# Patient Record
Sex: Female | Born: 1995 | Race: Black or African American | Hispanic: No | Marital: Single | Smoking: Never smoker
Health system: Southern US, Community
[De-identification: ages and names within clinical notes are randomized; demographics above are authoritative.]

## PROBLEM LIST (undated history)

## (undated) ENCOUNTER — Inpatient Hospital Stay (HOSPITAL_COMMUNITY): Payer: Self-pay

## (undated) DIAGNOSIS — Z789 Other specified health status: Secondary | ICD-10-CM

## (undated) HISTORY — DX: Other specified health status: Z78.9

## (undated) HISTORY — PX: THERAPEUTIC ABORTION: SHX798

---
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
## 2014-01-02 ENCOUNTER — Encounter: Payer: Self-pay | Admitting: Family Medicine

## 2014-01-03 ENCOUNTER — Other Ambulatory Visit: Payer: Self-pay

## 2014-01-03 MED ORDER — PRENATAL VITAMINS 0.8 MG PO TABS: 1.0000 | ORAL_TABLET | Freq: Every day | ORAL | Status: DC

## 2014-01-09 ENCOUNTER — Ambulatory Visit (INDEPENDENT_AMBULATORY_CARE_PROVIDER_SITE_OTHER): Payer: Medicaid Other | Admitting: Family Medicine

## 2014-01-09 ENCOUNTER — Encounter: Payer: Self-pay | Admitting: Family Medicine

## 2014-01-09 VITALS — BP 102/67 | Temp 97.8°F | Wt 125.7 lb

## 2014-01-09 DIAGNOSIS — Z3400 Encounter for supervision of normal first pregnancy, unspecified trimester: Secondary | ICD-10-CM

## 2014-01-09 LAB — POCT URINALYSIS DIP (DEVICE)
BILIRUBIN URINE: NEGATIVE
GLUCOSE, UA: NEGATIVE mg/dL
HGB URINE DIPSTICK: NEGATIVE
KETONES UR: NEGATIVE mg/dL
Nitrite: NEGATIVE
Protein, ur: NEGATIVE mg/dL
SPECIFIC GRAVITY, URINE: 1.02 (ref 1.005–1.030)
UROBILINOGEN UA: 1 mg/dL (ref 0.0–1.0)
pH: 7.5 (ref 5.0–8.0)

## 2014-01-09 NOTE — Progress Notes (Signed)
Pulse: 97

## 2014-01-09 NOTE — Patient Instructions (Signed)
Third Trimester of Pregnancy  The third trimester is from week 29 through week 42, months 7 through 9. The third trimester is a time when the fetus is growing rapidly. At the end of the ninth month, the fetus is about 20 inches in length and weighs 6 10 pounds.   BODY CHANGES  Your body goes through many changes during pregnancy. The changes vary from woman to woman.    Your weight will continue to increase. You can expect to gain 25 35 pounds (11 16 kg) by the end of the pregnancy.   You may begin to get stretch marks on your hips, abdomen, and breasts.   You may urinate more often because the fetus is moving lower into your pelvis and pressing on your bladder.   You may develop or continue to have heartburn as a result of your pregnancy.   You may develop constipation because certain hormones are causing the muscles that push waste through your intestines to slow down.   You may develop hemorrhoids or swollen, bulging veins (varicose veins).   You may have pelvic pain because of the weight gain and pregnancy hormones relaxing your joints between the bones in your pelvis. Back aches may result from over exertion of the muscles supporting your posture.   Your breasts will continue to grow and be tender. A yellow discharge may leak from your breasts called colostrum.   Your belly button may stick out.   You may feel short of breath because of your expanding uterus.   You may notice the fetus "dropping," or moving lower in your abdomen.   You may have a bloody mucus discharge. This usually occurs a few days to a week before labor begins.   Your cervix becomes thin and soft (effaced) near your due date.  WHAT TO EXPECT AT YOUR PRENATAL EXAMS   You will have prenatal exams every 2 weeks until week 36. Then, you will have weekly prenatal exams. During a routine prenatal visit:   You will be weighed to make sure you and the fetus are growing normally.   Your blood pressure is taken.   Your abdomen will be  measured to track your baby's growth.   The fetal heartbeat will be listened to.   Any test results from the previous visit will be discussed.   You may have a cervical check near your due date to see if you have effaced.  At around 36 weeks, your caregiver will check your cervix. At the same time, your caregiver will also perform a test on the secretions of the vaginal tissue. This test is to determine if a type of bacteria, Group B streptococcus, is present. Your caregiver will explain this further.  Your caregiver may ask you:   What your birth plan is.   How you are feeling.   If you are feeling the baby move.   If you have had any abnormal symptoms, such as leaking fluid, bleeding, severe headaches, or abdominal cramping.   If you have any questions.  Other tests or screenings that may be performed during your third trimester include:   Blood tests that check for low iron levels (anemia).   Fetal testing to check the health, activity level, and growth of the fetus. Testing is done if you have certain medical conditions or if there are problems during the pregnancy.  FALSE LABOR  You may feel small, irregular contractions that eventually go away. These are called Braxton Hicks contractions, or   false labor. Contractions may last for hours, days, or even weeks before true labor sets in. If contractions come at regular intervals, intensify, or become painful, it is best to be seen by your caregiver.   SIGNS OF LABOR    Menstrual-like cramps.   Contractions that are 5 minutes apart or less.   Contractions that start on the top of the uterus and spread down to the lower abdomen and back.   A sense of increased pelvic pressure or back pain.   A watery or bloody mucus discharge that comes from the vagina.  If you have any of these signs before the 37th week of pregnancy, call your caregiver right away. You need to go to the hospital to get checked immediately.  HOME CARE INSTRUCTIONS    Avoid all  smoking, herbs, alcohol, and unprescribed drugs. These chemicals affect the formation and growth of the baby.   Follow your caregiver's instructions regarding medicine use. There are medicines that are either safe or unsafe to take during pregnancy.   Exercise only as directed by your caregiver. Experiencing uterine cramps is a good sign to stop exercising.   Continue to eat regular, healthy meals.   Wear a good support bra for breast tenderness.   Do not use hot tubs, steam rooms, or saunas.   Wear your seat belt at all times when driving.   Avoid raw meat, uncooked cheese, cat litter boxes, and soil used by cats. These carry germs that can cause birth defects in the baby.   Take your prenatal vitamins.   Try taking a stool softener (if your caregiver approves) if you develop constipation. Eat more high-fiber foods, such as fresh vegetables or fruit and whole grains. Drink plenty of fluids to keep your urine clear or pale yellow.   Take warm sitz baths to soothe any pain or discomfort caused by hemorrhoids. Use hemorrhoid cream if your caregiver approves.   If you develop varicose veins, wear support hose. Elevate your feet for 15 minutes, 3 4 times a day. Limit salt in your diet.   Avoid heavy lifting, wear low heal shoes, and practice good posture.   Rest a lot with your legs elevated if you have leg cramps or low back pain.   Visit your dentist if you have not gone during your pregnancy. Use a soft toothbrush to brush your teeth and be gentle when you floss.   A sexual relationship may be continued unless your caregiver directs you otherwise.   Do not travel far distances unless it is absolutely necessary and only with the approval of your caregiver.   Take prenatal classes to understand, practice, and ask questions about the labor and delivery.   Make a trial run to the hospital.   Pack your hospital bag.   Prepare the baby's nursery.   Continue to go to all your prenatal visits as directed  by your caregiver.  SEEK MEDICAL CARE IF:   You are unsure if you are in labor or if your water has broken.   You have dizziness.   You have mild pelvic cramps, pelvic pressure, or nagging pain in your abdominal area.   You have persistent nausea, vomiting, or diarrhea.   You have a bad smelling vaginal discharge.   You have pain with urination.  SEEK IMMEDIATE MEDICAL CARE IF:    You have a fever.   You are leaking fluid from your vagina.   You have spotting or bleeding from your vagina.     You have severe abdominal cramping or pain.   You have rapid weight loss or gain.   You have shortness of breath with chest pain.   You notice sudden or extreme swelling of your face, hands, ankles, feet, or legs.   You have not felt your baby move in over an hour.   You have severe headaches that do not go away with medicine.   You have vision changes.  Document Released: 11/04/2001 Document Revised: 07/13/2013 Document Reviewed: 01/11/2013  [employer] Patient Information 2014 [employer].

## 2014-01-09 NOTE — Progress Notes (Signed)
+  FM, no lof, no vb, no ctx  Cindy Ware is a 18 y.o. G1P0 at 708w0d by L=19 here for ROB visit.  Discussed with Patient:  -Plans to bottle feed.  All questions answered. -Continue prenatal vitamins. -Reviewed fetal kick counts (Pt to perform daily at a time when the baby is active, lie laterally with both hands on belly in quiet room and count all movements (hiccups, shoulder rolls, obvious kicks, etc); pt is to report to clinic or MAU for less than 10 movements felt in a one hour time period-pt told as soon as she counts 10 movements the count is complete.)  - Routine precautions discussed (depression, infection s/s).   Patient provided with all pertinent phone numbers for emergencies. - RTC for any VB, regular, painful cramps/ctxs occurring at a rate of >2/10 min, fever (100.5 or higher), n/v/d, any pain that is unresolving or worsening, LOF, decreased fetal movement, CP, SOB, edema  Problems: Patient Active Problem List   Diagnosis Date Noted  . Supervision of normal first pregnancy 08/17/2013    To Do: 1.   [ ]  Vaccines: recd [ ]  BCM:  depo  Edu: [x ] PTL precautions; [ ]  BF class; [ ]  childbirth class; [ ]   BF counseling;

## 2014-02-01 ENCOUNTER — Encounter: Payer: Self-pay | Admitting: Obstetrics and Gynecology

## 2014-02-01 ENCOUNTER — Ambulatory Visit (INDEPENDENT_AMBULATORY_CARE_PROVIDER_SITE_OTHER): Payer: Medicaid Other | Admitting: Obstetrics and Gynecology

## 2014-02-01 VITALS — BP 109/70 | Temp 97.4°F | Wt 128.2 lb

## 2014-02-01 DIAGNOSIS — Z3400 Encounter for supervision of normal first pregnancy, unspecified trimester: Secondary | ICD-10-CM

## 2014-02-01 LAB — POCT URINALYSIS DIP (DEVICE)
BILIRUBIN URINE: NEGATIVE
GLUCOSE, UA: NEGATIVE mg/dL
KETONES UR: NEGATIVE mg/dL
NITRITE: NEGATIVE
PH: 7 (ref 5.0–8.0)
Protein, ur: NEGATIVE mg/dL
SPECIFIC GRAVITY, URINE: 1.02 (ref 1.005–1.030)
Urobilinogen, UA: 0.2 mg/dL (ref 0.0–1.0)

## 2014-02-01 NOTE — Patient Instructions (Signed)
Third Trimester of Pregnancy  The third trimester is from week 29 through week 42, months 7 through 9. The third trimester is a time when the fetus is growing rapidly. At the end of the ninth month, the fetus is about 20 inches in length and weighs 6 10 pounds.   BODY CHANGES  Your body goes through many changes during pregnancy. The changes vary from woman to woman.    Your weight will continue to increase. You can expect to gain 25 35 pounds (11 16 kg) by the end of the pregnancy.   You may begin to get stretch marks on your hips, abdomen, and breasts.   You may urinate more often because the fetus is moving lower into your pelvis and pressing on your bladder.   You may develop or continue to have heartburn as a result of your pregnancy.   You may develop constipation because certain hormones are causing the muscles that push waste through your intestines to slow down.   You may develop hemorrhoids or swollen, bulging veins (varicose veins).   You may have pelvic pain because of the weight gain and pregnancy hormones relaxing your joints between the bones in your pelvis. Back aches may result from over exertion of the muscles supporting your posture.   Your breasts will continue to grow and be tender. A yellow discharge may leak from your breasts called colostrum.   Your belly button may stick out.   You may feel short of breath because of your expanding uterus.   You may notice the fetus "dropping," or moving lower in your abdomen.   You may have a bloody mucus discharge. This usually occurs a few days to a week before labor begins.   Your cervix becomes thin and soft (effaced) near your due date.  WHAT TO EXPECT AT YOUR PRENATAL EXAMS   You will have prenatal exams every 2 weeks until week 36. Then, you will have weekly prenatal exams. During a routine prenatal visit:   You will be weighed to make sure you and the fetus are growing normally.   Your blood pressure is taken.   Your abdomen will be  measured to track your baby's growth.   The fetal heartbeat will be listened to.   Any test results from the previous visit will be discussed.   You may have a cervical check near your due date to see if you have effaced.  At around 36 weeks, your caregiver will check your cervix. At the same time, your caregiver will also perform a test on the secretions of the vaginal tissue. This test is to determine if a type of bacteria, Group B streptococcus, is present. Your caregiver will explain this further.  Your caregiver may ask you:   What your birth plan is.   How you are feeling.   If you are feeling the baby move.   If you have had any abnormal symptoms, such as leaking fluid, bleeding, severe headaches, or abdominal cramping.   If you have any questions.  Other tests or screenings that may be performed during your third trimester include:   Blood tests that check for low iron levels (anemia).   Fetal testing to check the health, activity level, and growth of the fetus. Testing is done if you have certain medical conditions or if there are problems during the pregnancy.  FALSE LABOR  You may feel small, irregular contractions that eventually go away. These are called Braxton Hicks contractions, or   false labor. Contractions may last for hours, days, or even weeks before true labor sets in. If contractions come at regular intervals, intensify, or become painful, it is best to be seen by your caregiver.   SIGNS OF LABOR    Menstrual-like cramps.   Contractions that are 5 minutes apart or less.   Contractions that start on the top of the uterus and spread down to the lower abdomen and back.   A sense of increased pelvic pressure or back pain.   A watery or bloody mucus discharge that comes from the vagina.  If you have any of these signs before the 37th week of pregnancy, call your caregiver right away. You need to go to the hospital to get checked immediately.  HOME CARE INSTRUCTIONS    Avoid all  smoking, herbs, alcohol, and unprescribed drugs. These chemicals affect the formation and growth of the baby.   Follow your caregiver's instructions regarding medicine use. There are medicines that are either safe or unsafe to take during pregnancy.   Exercise only as directed by your caregiver. Experiencing uterine cramps is a good sign to stop exercising.   Continue to eat regular, healthy meals.   Wear a good support bra for breast tenderness.   Do not use hot tubs, steam rooms, or saunas.   Wear your seat belt at all times when driving.   Avoid raw meat, uncooked cheese, cat litter boxes, and soil used by cats. These carry germs that can cause birth defects in the baby.   Take your prenatal vitamins.   Try taking a stool softener (if your caregiver approves) if you develop constipation. Eat more high-fiber foods, such as fresh vegetables or fruit and whole grains. Drink plenty of fluids to keep your urine clear or pale yellow.   Take warm sitz baths to soothe any pain or discomfort caused by hemorrhoids. Use hemorrhoid cream if your caregiver approves.   If you develop varicose veins, wear support hose. Elevate your feet for 15 minutes, 3 4 times a day. Limit salt in your diet.   Avoid heavy lifting, wear low heal shoes, and practice good posture.   Rest a lot with your legs elevated if you have leg cramps or low back pain.   Visit your dentist if you have not gone during your pregnancy. Use a soft toothbrush to brush your teeth and be gentle when you floss.   A sexual relationship may be continued unless your caregiver directs you otherwise.   Do not travel far distances unless it is absolutely necessary and only with the approval of your caregiver.   Take prenatal classes to understand, practice, and ask questions about the labor and delivery.   Make a trial run to the hospital.   Pack your hospital bag.   Prepare the baby's nursery.   Continue to go to all your prenatal visits as directed  by your caregiver.  SEEK MEDICAL CARE IF:   You are unsure if you are in labor or if your water has broken.   You have dizziness.   You have mild pelvic cramps, pelvic pressure, or nagging pain in your abdominal area.   You have persistent nausea, vomiting, or diarrhea.   You have a bad smelling vaginal discharge.   You have pain with urination.  SEEK IMMEDIATE MEDICAL CARE IF:    You have a fever.   You are leaking fluid from your vagina.   You have spotting or bleeding from your vagina.     You have severe abdominal cramping or pain.   You have rapid weight loss or gain.   You have shortness of breath with chest pain.   You notice sudden or extreme swelling of your face, hands, ankles, feet, or legs.   You have not felt your baby move in over an hour.   You have severe headaches that do not go away with medicine.   You have vision changes.  Document Released: 11/04/2001 Document Revised: 07/13/2013 Document Reviewed: 01/11/2013  [employer] Patient Information 2014 [employer].

## 2014-02-01 NOTE — Progress Notes (Signed)
Doing well. Fetus very active. RLP, Depo discussed. Encouraged to try breastfeeding.

## 2014-02-01 NOTE — Progress Notes (Signed)
P - 97 

## 2014-02-15 ENCOUNTER — Ambulatory Visit (INDEPENDENT_AMBULATORY_CARE_PROVIDER_SITE_OTHER): Payer: Medicaid Other | Admitting: Family

## 2014-02-15 VITALS — BP 103/73 | Wt 129.0 lb

## 2014-02-15 DIAGNOSIS — R8271 Bacteriuria: Secondary | ICD-10-CM

## 2014-02-15 DIAGNOSIS — O99891 Other specified diseases and conditions complicating pregnancy: Secondary | ICD-10-CM

## 2014-02-15 DIAGNOSIS — Z3400 Encounter for supervision of normal first pregnancy, unspecified trimester: Secondary | ICD-10-CM

## 2014-02-15 DIAGNOSIS — O9989 Other specified diseases and conditions complicating pregnancy, childbirth and the puerperium: Secondary | ICD-10-CM

## 2014-02-15 LAB — POCT URINALYSIS DIP (DEVICE)
BILIRUBIN URINE: NEGATIVE
GLUCOSE, UA: NEGATIVE mg/dL
HGB URINE DIPSTICK: NEGATIVE
KETONES UR: NEGATIVE mg/dL
NITRITE: NEGATIVE
PH: 7 (ref 5.0–8.0)
Protein, ur: NEGATIVE mg/dL
Specific Gravity, Urine: 1.015 (ref 1.005–1.030)
Urobilinogen, UA: 0.2 mg/dL (ref 0.0–1.0)

## 2014-02-15 LAB — OB RESULTS CONSOLE GC/CHLAMYDIA
Chlamydia: NEGATIVE
GC PROBE AMP, GENITAL: NEGATIVE

## 2014-02-15 LAB — OB RESULTS CONSOLE GBS: GBS: NEGATIVE

## 2014-02-15 NOTE — Progress Notes (Signed)
No UTI symptoms > urine culture sent.

## 2014-02-15 NOTE — Progress Notes (Signed)
No questions or concerns; doing well.  GBS and GC/CT collected.

## 2014-02-15 NOTE — Progress Notes (Signed)
Pulse: 100

## 2014-02-16 LAB — GC/CHLAMYDIA PROBE AMP
CT PROBE, AMP APTIMA: NEGATIVE
GC PROBE AMP APTIMA: NEGATIVE

## 2014-02-17 LAB — CULTURE, OB URINE: Colony Count: 15000

## 2014-02-17 LAB — CULTURE, BETA STREP (GROUP B ONLY)

## 2014-02-23 ENCOUNTER — Ambulatory Visit (INDEPENDENT_AMBULATORY_CARE_PROVIDER_SITE_OTHER): Payer: Medicaid Other | Admitting: Obstetrics & Gynecology

## 2014-02-23 VITALS — BP 108/74 | Wt 132.2 lb

## 2014-02-23 DIAGNOSIS — Z3400 Encounter for supervision of normal first pregnancy, unspecified trimester: Secondary | ICD-10-CM

## 2014-02-23 LAB — POCT URINALYSIS DIP (DEVICE)
Bilirubin Urine: NEGATIVE
Glucose, UA: NEGATIVE mg/dL
Hgb urine dipstick: NEGATIVE
KETONES UR: NEGATIVE mg/dL
Nitrite: NEGATIVE
PH: 7 (ref 5.0–8.0)
PROTEIN: NEGATIVE mg/dL
SPECIFIC GRAVITY, URINE: 1.015 (ref 1.005–1.030)
UROBILINOGEN UA: 0.2 mg/dL (ref 0.0–1.0)

## 2014-02-23 NOTE — Patient Instructions (Signed)
Third Trimester of Pregnancy  The third trimester is from week 29 through week 42, months 7 through 9. The third trimester is a time when the fetus is growing rapidly. At the end of the ninth month, the fetus is about 20 inches in length and weighs 6 10 pounds.   BODY CHANGES  Your body goes through many changes during pregnancy. The changes vary from woman to woman.    Your weight will continue to increase. You can expect to gain 25 35 pounds (11 16 kg) by the end of the pregnancy.   You may begin to get stretch marks on your hips, abdomen, and breasts.   You may urinate more often because the fetus is moving lower into your pelvis and pressing on your bladder.   You may develop or continue to have heartburn as a result of your pregnancy.   You may develop constipation because certain hormones are causing the muscles that push waste through your intestines to slow down.   You may develop hemorrhoids or swollen, bulging veins (varicose veins).   You may have pelvic pain because of the weight gain and pregnancy hormones relaxing your joints between the bones in your pelvis. Back aches may result from over exertion of the muscles supporting your posture.   Your breasts will continue to grow and be tender. A yellow discharge may leak from your breasts called colostrum.   Your belly button may stick out.   You may feel short of breath because of your expanding uterus.   You may notice the fetus "dropping," or moving lower in your abdomen.   You may have a bloody mucus discharge. This usually occurs a few days to a week before labor begins.   Your cervix becomes thin and soft (effaced) near your due date.  WHAT TO EXPECT AT YOUR PRENATAL EXAMS   You will have prenatal exams every 2 weeks until week 36. Then, you will have weekly prenatal exams. During a routine prenatal visit:   You will be weighed to make sure you and the fetus are growing normally.   Your blood pressure is taken.   Your abdomen will be  measured to track your baby's growth.   The fetal heartbeat will be listened to.   Any test results from the previous visit will be discussed.   You may have a cervical check near your due date to see if you have effaced.  At around 36 weeks, your caregiver will check your cervix. At the same time, your caregiver will also perform a test on the secretions of the vaginal tissue. This test is to determine if a type of bacteria, Group B streptococcus, is present. Your caregiver will explain this further.  Your caregiver may ask you:   What your birth plan is.   How you are feeling.   If you are feeling the baby move.   If you have had any abnormal symptoms, such as leaking fluid, bleeding, severe headaches, or abdominal cramping.   If you have any questions.  Other tests or screenings that may be performed during your third trimester include:   Blood tests that check for low iron levels (anemia).   Fetal testing to check the health, activity level, and growth of the fetus. Testing is done if you have certain medical conditions or if there are problems during the pregnancy.  FALSE LABOR  You may feel small, irregular contractions that eventually go away. These are called Braxton Hicks contractions, or   false labor. Contractions may last for hours, days, or even weeks before true labor sets in. If contractions come at regular intervals, intensify, or become painful, it is best to be seen by your caregiver.   SIGNS OF LABOR    Menstrual-like cramps.   Contractions that are 5 minutes apart or less.   Contractions that start on the top of the uterus and spread down to the lower abdomen and back.   A sense of increased pelvic pressure or back pain.   A watery or bloody mucus discharge that comes from the vagina.  If you have any of these signs before the 37th week of pregnancy, call your caregiver right away. You need to go to the hospital to get checked immediately.  HOME CARE INSTRUCTIONS    Avoid all  smoking, herbs, alcohol, and unprescribed drugs. These chemicals affect the formation and growth of the baby.   Follow your caregiver's instructions regarding medicine use. There are medicines that are either safe or unsafe to take during pregnancy.   Exercise only as directed by your caregiver. Experiencing uterine cramps is a good sign to stop exercising.   Continue to eat regular, healthy meals.   Wear a good support bra for breast tenderness.   Do not use hot tubs, steam rooms, or saunas.   Wear your seat belt at all times when driving.   Avoid raw meat, uncooked cheese, cat litter boxes, and soil used by cats. These carry germs that can cause birth defects in the baby.   Take your prenatal vitamins.   Try taking a stool softener (if your caregiver approves) if you develop constipation. Eat more high-fiber foods, such as fresh vegetables or fruit and whole grains. Drink plenty of fluids to keep your urine clear or pale yellow.   Take warm sitz baths to soothe any pain or discomfort caused by hemorrhoids. Use hemorrhoid cream if your caregiver approves.   If you develop varicose veins, wear support hose. Elevate your feet for 15 minutes, 3 4 times a day. Limit salt in your diet.   Avoid heavy lifting, wear low heal shoes, and practice good posture.   Rest a lot with your legs elevated if you have leg cramps or low back pain.   Visit your dentist if you have not gone during your pregnancy. Use a soft toothbrush to brush your teeth and be gentle when you floss.   A sexual relationship may be continued unless your caregiver directs you otherwise.   Do not travel far distances unless it is absolutely necessary and only with the approval of your caregiver.   Take prenatal classes to understand, practice, and ask questions about the labor and delivery.   Make a trial run to the hospital.   Pack your hospital bag.   Prepare the baby's nursery.   Continue to go to all your prenatal visits as directed  by your caregiver.  SEEK MEDICAL CARE IF:   You are unsure if you are in labor or if your water has broken.   You have dizziness.   You have mild pelvic cramps, pelvic pressure, or nagging pain in your abdominal area.   You have persistent nausea, vomiting, or diarrhea.   You have a bad smelling vaginal discharge.   You have pain with urination.  SEEK IMMEDIATE MEDICAL CARE IF:    You have a fever.   You are leaking fluid from your vagina.   You have spotting or bleeding from your vagina.     You have severe abdominal cramping or pain.   You have rapid weight loss or gain.   You have shortness of breath with chest pain.   You notice sudden or extreme swelling of your face, hands, ankles, feet, or legs.   You have not felt your baby move in over an hour.   You have severe headaches that do not go away with medicine.   You have vision changes.  Document Released: 11/04/2001 Document Revised: 07/13/2013 Document Reviewed: 01/11/2013  [employer] Patient Information 2014 [employer].

## 2014-02-23 NOTE — Progress Notes (Signed)
Pulse- 87 

## 2014-02-23 NOTE — Progress Notes (Signed)
Doing well. Fetus active. No bleeding, fluid leaking, cramping/contractions. Given handout on circumcision locations. Vertex via [place]. F/u in 1 week.

## 2014-03-01 ENCOUNTER — Ambulatory Visit (INDEPENDENT_AMBULATORY_CARE_PROVIDER_SITE_OTHER): Payer: Medicaid Other | Admitting: Obstetrics and Gynecology

## 2014-03-01 VITALS — BP 107/71 | Temp 96.5°F | Wt 134.0 lb

## 2014-03-01 DIAGNOSIS — Z3400 Encounter for supervision of normal first pregnancy, unspecified trimester: Secondary | ICD-10-CM

## 2014-03-01 DIAGNOSIS — O09899 Supervision of other high risk pregnancies, unspecified trimester: Secondary | ICD-10-CM

## 2014-03-01 DIAGNOSIS — O09893 Supervision of other high risk pregnancies, third trimester: Secondary | ICD-10-CM | POA: Insufficient documentation

## 2014-03-01 LAB — POCT URINALYSIS DIP (DEVICE)
Bilirubin Urine: NEGATIVE
Glucose, UA: NEGATIVE mg/dL
HGB URINE DIPSTICK: NEGATIVE
Ketones, ur: NEGATIVE mg/dL
NITRITE: NEGATIVE
PROTEIN: NEGATIVE mg/dL
SPECIFIC GRAVITY, URINE: 1.015 (ref 1.005–1.030)
UROBILINOGEN UA: 1 mg/dL (ref 0.0–1.0)
pH: 7 (ref 5.0–8.0)

## 2014-03-01 NOTE — Patient Instructions (Signed)

## 2014-03-01 NOTE — Progress Notes (Signed)
Teen G1 with excellent support system. Has graduated HS. Discussed feeding> may try breastfeeding colostrum while in hospital. S/sx labor and danger signs reviewed.

## 2014-03-01 NOTE — Progress Notes (Signed)
P= 101 C/o of intermittent pelvic pressure and braxton hicks.

## 2014-03-08 ENCOUNTER — Ambulatory Visit (INDEPENDENT_AMBULATORY_CARE_PROVIDER_SITE_OTHER): Payer: Medicaid Other | Admitting: Obstetrics & Gynecology

## 2014-03-08 ENCOUNTER — Encounter: Payer: Self-pay | Admitting: Obstetrics & Gynecology

## 2014-03-08 VITALS — BP 109/70 | Temp 97.0°F | Wt 134.1 lb

## 2014-03-08 DIAGNOSIS — O09893 Supervision of other high risk pregnancies, third trimester: Secondary | ICD-10-CM

## 2014-03-08 DIAGNOSIS — Z3400 Encounter for supervision of normal first pregnancy, unspecified trimester: Secondary | ICD-10-CM

## 2014-03-08 DIAGNOSIS — O09899 Supervision of other high risk pregnancies, unspecified trimester: Secondary | ICD-10-CM

## 2014-03-08 LAB — POCT URINALYSIS DIP (DEVICE)
BILIRUBIN URINE: NEGATIVE
Glucose, UA: NEGATIVE mg/dL
HGB URINE DIPSTICK: NEGATIVE
Ketones, ur: NEGATIVE mg/dL
NITRITE: NEGATIVE
PH: 7 (ref 5.0–8.0)
Protein, ur: NEGATIVE mg/dL
SPECIFIC GRAVITY, URINE: 1.015 (ref 1.005–1.030)
Urobilinogen, UA: 0.2 mg/dL (ref 0.0–1.0)

## 2014-03-08 MED ORDER — PRENATAL VITAMINS 0.8 MG PO TABS: 1.0000 | ORAL_TABLET | Freq: Every day | ORAL | Status: DC

## 2014-03-08 NOTE — Progress Notes (Signed)
P= 105 C/o of intermittent lower abdominal/pelvic pressure and braxton hicks.

## 2014-03-08 NOTE — Progress Notes (Signed)
Routine visit. Good FM. No problems. Labor precautions reviewed. 

## 2014-03-11 ENCOUNTER — Inpatient Hospital Stay (HOSPITAL_COMMUNITY)
Admission: AD | Admit: 2014-03-11 | Discharge: 2014-03-13 | DRG: 775 | Disposition: A | Discharge status: Home / Self Care | Payer: Medicaid Other | Source: Ambulatory Visit | Attending: Obstetrics & Gynecology | Admitting: Obstetrics & Gynecology

## 2014-03-11 ENCOUNTER — Encounter (HOSPITAL_COMMUNITY): Payer: Self-pay

## 2014-03-11 ENCOUNTER — Inpatient Hospital Stay (HOSPITAL_COMMUNITY): Payer: Medicaid Other

## 2014-03-11 DIAGNOSIS — O09893 Supervision of other high risk pregnancies, third trimester: Secondary | ICD-10-CM

## 2014-03-11 DIAGNOSIS — O4103X0 Oligohydramnios, third trimester, not applicable or unspecified: Secondary | ICD-10-CM | POA: Diagnosis present

## 2014-03-11 DIAGNOSIS — O4100X0 Oligohydramnios, unspecified trimester, not applicable or unspecified: Principal | ICD-10-CM | POA: Diagnosis present

## 2014-03-11 DIAGNOSIS — Z3400 Encounter for supervision of normal first pregnancy, unspecified trimester: Secondary | ICD-10-CM

## 2014-03-11 NOTE — Progress Notes (Signed)
Notified of pt arrival in MAU, cervical exam and FHR deceleration. Will watch for 1 hour and recheck

## 2014-03-11 NOTE — Progress Notes (Signed)
Notified of deceleration in FHR. Received orders for BPP

## 2014-03-11 NOTE — H&P (Signed)
Cindy Ware is a 18 y.o. G1P0 female at 7375w5d by LMP c/w 19wk u/s, presenting initially for RN labor eval, cx was 1/30/-2 upon arrival, and had 2 variables while on efm, so a BPP was ordered that revealed low fluid w/ formal AFI of 0.8cm.  Reports active fetal movement, contractions: regular, vaginal bleeding: spotting earlier today, none since, membranes: intact- denies any lof or any increased d/c. [facility]    Prenatal History/Complications: none  Past Medical History: Past Medical History  Diagnosis Date  . Medical history non-contributory     Past Surgical History: Past Surgical History  Procedure Laterality Date  . No past surgeries      Obstetrical History: OB History   Grav Para Term Preterm Abortions TAB SAB Ect Mult Living   1               Social History: History   Social History  . Marital Status: Single    Spouse Name: N/A    Number of Children: N/A  . Years of Education: N/A   Social History Main Topics  . Smoking status: Never Smoker   . Smokeless tobacco: Never Used  . Alcohol Use: No  . Drug Use: Yes    Special: Marijuana     Comment: no use since pregnancy  . Sexual Activity: Yes    Birth Control/ Protection: None   Other Topics Concern  . None   Social History Narrative  . None    Family History: History reviewed. No pertinent family history.  Allergies: No Known Allergies  Facility-administered medications prior to admission  Medication Dose Route Frequency Provider Last Rate Last Dose  . Tdap (BOOSTRIX) injection 0.5 mL  0.5 mL Intramuscular Once Willodean Rosenthalarolyn Harraway-Smith, MD       Prescriptions prior to admission  Medication Sig Dispense Refill  . Prenatal Multivit-Min-Fe-FA (PRENATAL VITAMINS) 0.8 MG tablet Take 1 tablet by mouth daily.  30 tablet  1     Review of Systems  Pertinent pos/neg as indicated in HPI    Blood pressure 110/66, pulse 95, temperature 98.8 F (37.1 C),  temperature source Oral, resp. rate 16, height 5\' 3"  (1.6 m), weight 60.782 kg (134 lb), last menstrual period 06/06/2013, SpO2 98.00%. General appearance: alert, cooperative and no distress Lungs: clear to auscultation bilaterally Heart: regular rate and rhythm Abdomen: gravid, soft, non-tender Extremities: No edema DTR's 2+  Fetal monitoring: FHR: 135 bpm, variability: moderate,  Accelerations: Present,  decelerations:  Present variable x 2 Uterine activity: mild, occasional  Dilation: 1 Effacement (%): 30 Station: -2 Exam by:: Sharen Hintaroline Brewer RN Presentation: cephalic   Prenatal labs: ABO, Rh: A/NEG/-- (09/24 1218) Antibody: NEG (01/27 1131) Rubella:  Immune RPR: NON REAC (01/27 1131)  HBsAg: NEGATIVE (09/24 1218)  HIV: NON REACTIVE (01/27 1131)  GBS: Negative (03/25 0000)   1 hr Glucola: 77 Genetic screening:  declined Anatomy US: normal  No results found for this or any previous visit (from the past 24 hour(s)).   Assessment:  5175w5d SIUP  G1P0  Oligohydramnios, AFI 0.8cm  Cat 2 FHR  GBS Negative (03/25 0000)  Plan:  Admit to BS  IV pain meds/epidural prn active labor  Plan for cervical foley bulb, then pitocin when it falls out  Anticipate NSVD   Marge DuncansKimberly Randall Booker CNM, WHNP-BC 03/11/2014, 11:11 PM

## 2014-03-11 NOTE — MAU Note (Signed)
Pt reports some spotting today, contractions all day, contractions are getting stronger.

## 2014-03-11 NOTE — Progress Notes (Signed)
Pt off monitors and taken to birthing suites per provider orders.

## 2014-03-12 ENCOUNTER — Encounter (HOSPITAL_COMMUNITY): Payer: Medicaid Other | Admitting: Anesthesiology

## 2014-03-12 ENCOUNTER — Inpatient Hospital Stay (HOSPITAL_COMMUNITY): Payer: Medicaid Other | Admitting: Anesthesiology

## 2014-03-12 ENCOUNTER — Encounter (HOSPITAL_COMMUNITY): Payer: Self-pay | Admitting: *Deleted

## 2014-03-12 DIAGNOSIS — O4100X0 Oligohydramnios, unspecified trimester, not applicable or unspecified: Secondary | ICD-10-CM

## 2014-03-12 DIAGNOSIS — O4103X0 Oligohydramnios, third trimester, not applicable or unspecified: Secondary | ICD-10-CM | POA: Diagnosis present

## 2014-03-12 LAB — CBC
HCT: 38.4 % (ref 36.0–46.0)
Hemoglobin: 13.3 g/dL (ref 12.0–15.0)
MCH: 32.8 pg (ref 26.0–34.0)
MCHC: 34.6 g/dL (ref 30.0–36.0)
MCV: 94.8 fL (ref 78.0–100.0)
PLATELETS: 198 10*3/uL (ref 150–400)
RBC: 4.05 MIL/uL (ref 3.87–5.11)
RDW: 13.3 % (ref 11.5–15.5)
WBC: 11.9 10*3/uL — ABNORMAL HIGH (ref 4.0–10.5)

## 2014-03-12 LAB — TYPE AND SCREEN
ABO/RH(D): A NEG
Antibody Screen: NEGATIVE

## 2014-03-12 LAB — RPR

## 2014-03-12 LAB — ABO/RH: ABO/RH(D): A NEG

## 2014-03-12 MED ORDER — SIMETHICONE 80 MG PO CHEW: 80.0000 mg | CHEWABLE_TABLET | ORAL | Status: DC | PRN

## 2014-03-12 MED ORDER — IBUPROFEN 600 MG PO TABS
600.0000 mg | ORAL_TABLET | Freq: Four times a day (QID) | ORAL | Status: DC
  Administered 2014-03-12 – 2014-03-13 (×4): 600 mg via ORAL
  Filled 2014-03-12 (×4): qty 1

## 2014-03-12 MED ORDER — HYDROXYZINE HCL 50 MG PO TABS
50.0000 mg | ORAL_TABLET | Freq: Four times a day (QID) | ORAL | Status: DC | PRN
  Administered 2014-03-12: 50 mg via ORAL
  Filled 2014-03-12 (×2): qty 1

## 2014-03-12 MED ORDER — OXYTOCIN 40 UNITS IN LACTATED RINGERS INFUSION - SIMPLE MED
62.5000 mL/h | INTRAVENOUS | Status: DC
  Filled 2014-03-12: qty 1000

## 2014-03-12 MED ORDER — DIBUCAINE 1 % RE OINT: 1.0000 "application " | TOPICAL_OINTMENT | RECTAL | Status: DC | PRN

## 2014-03-12 MED ORDER — FLEET ENEMA 7-19 GM/118ML RE ENEM: 1.0000 | ENEMA | RECTAL | Status: DC | PRN

## 2014-03-12 MED ORDER — FENTANYL 2.5 MCG/ML BUPIVACAINE 1/10 % EPIDURAL INFUSION (WH - ANES)
14.0000 mL/h | INTRAMUSCULAR | Status: DC | PRN
  Filled 2014-03-12: qty 125

## 2014-03-12 MED ORDER — ONDANSETRON HCL 4 MG PO TABS: 4.0000 mg | ORAL_TABLET | ORAL | Status: DC | PRN

## 2014-03-12 MED ORDER — IBUPROFEN 600 MG PO TABS: 600.0000 mg | ORAL_TABLET | Freq: Four times a day (QID) | ORAL | Status: DC | PRN

## 2014-03-12 MED ORDER — SENNOSIDES-DOCUSATE SODIUM 8.6-50 MG PO TABS
2.0000 | ORAL_TABLET | ORAL | Status: DC
Start: 2014-03-13 — End: 2014-03-13
  Administered 2014-03-13: 2 via ORAL
  Filled 2014-03-12: qty 2

## 2014-03-12 MED ORDER — OXYCODONE-ACETAMINOPHEN 5-325 MG PO TABS: 1.0000 | ORAL_TABLET | ORAL | Status: DC | PRN

## 2014-03-12 MED ORDER — PHENYLEPHRINE 40 MCG/ML (10ML) SYRINGE FOR IV PUSH (FOR BLOOD PRESSURE SUPPORT)
80.0000 ug | PREFILLED_SYRINGE | INTRAVENOUS | Status: DC | PRN
  Filled 2014-03-12: qty 2

## 2014-03-12 MED ORDER — LANOLIN HYDROUS EX OINT: TOPICAL_OINTMENT | CUTANEOUS | Status: DC | PRN

## 2014-03-12 MED ORDER — WITCH HAZEL-GLYCERIN EX PADS: 1.0000 "application " | MEDICATED_PAD | CUTANEOUS | Status: DC | PRN

## 2014-03-12 MED ORDER — ZOLPIDEM TARTRATE 5 MG PO TABS: 5.0000 mg | ORAL_TABLET | Freq: Every evening | ORAL | Status: DC | PRN

## 2014-03-12 MED ORDER — ACETAMINOPHEN 325 MG PO TABS: 650.0000 mg | ORAL_TABLET | ORAL | Status: DC | PRN

## 2014-03-12 MED ORDER — LACTATED RINGERS IV SOLN
500.0000 mL | Freq: Once | INTRAVENOUS | Status: AC
  Administered 2014-03-12: 500 mL via INTRAVENOUS

## 2014-03-12 MED ORDER — OXYTOCIN BOLUS FROM INFUSION
500.0000 mL | INTRAVENOUS | Status: DC
Start: 2014-03-12 — End: 2014-03-12

## 2014-03-12 MED ORDER — ONDANSETRON HCL 4 MG/2ML IJ SOLN: 4.0000 mg | INTRAMUSCULAR | Status: DC | PRN

## 2014-03-12 MED ORDER — ONDANSETRON HCL 4 MG/2ML IJ SOLN: 4.0000 mg | Freq: Four times a day (QID) | INTRAMUSCULAR | Status: DC | PRN

## 2014-03-12 MED ORDER — PRENATAL MULTIVITAMIN CH
1.0000 | ORAL_TABLET | Freq: Every day | ORAL | Status: DC
  Filled 2014-03-12: qty 1

## 2014-03-12 MED ORDER — EPHEDRINE 5 MG/ML INJ
10.0000 mg | INTRAVENOUS | Status: DC | PRN
  Filled 2014-03-12: qty 4
  Filled 2014-03-12: qty 2

## 2014-03-12 MED ORDER — BENZOCAINE-MENTHOL 20-0.5 % EX AERO: 1.0000 "application " | INHALATION_SPRAY | CUTANEOUS | Status: DC | PRN

## 2014-03-12 MED ORDER — CITRIC ACID-SODIUM CITRATE 334-500 MG/5ML PO SOLN: 30.0000 mL | ORAL | Status: DC | PRN

## 2014-03-12 MED ORDER — DIPHENHYDRAMINE HCL 50 MG/ML IJ SOLN: 12.5000 mg | INTRAMUSCULAR | Status: DC | PRN

## 2014-03-12 MED ORDER — FENTANYL 2.5 MCG/ML BUPIVACAINE 1/10 % EPIDURAL INFUSION (WH - ANES)
INTRAMUSCULAR | Status: DC | PRN
  Administered 2014-03-12: 14 mL/h via EPIDURAL

## 2014-03-12 MED ORDER — FENTANYL CITRATE 0.05 MG/ML IJ SOLN
100.0000 ug | INTRAMUSCULAR | Status: DC | PRN
  Administered 2014-03-12 (×2): 100 ug via INTRAVENOUS
  Filled 2014-03-12 (×2): qty 2

## 2014-03-12 MED ORDER — LACTATED RINGERS IV SOLN
500.0000 mL | INTRAVENOUS | Status: DC | PRN
  Administered 2014-03-12: 500 mL via INTRAVENOUS

## 2014-03-12 MED ORDER — DIPHENHYDRAMINE HCL 25 MG PO CAPS: 25.0000 mg | ORAL_CAPSULE | Freq: Four times a day (QID) | ORAL | Status: DC | PRN

## 2014-03-12 MED ORDER — PHENYLEPHRINE 40 MCG/ML (10ML) SYRINGE FOR IV PUSH (FOR BLOOD PRESSURE SUPPORT)
80.0000 ug | PREFILLED_SYRINGE | INTRAVENOUS | Status: DC | PRN
  Filled 2014-03-12: qty 2
  Filled 2014-03-12: qty 10

## 2014-03-12 MED ORDER — LIDOCAINE HCL (PF) 1 % IJ SOLN
INTRAMUSCULAR | Status: DC | PRN
  Administered 2014-03-12: 9 mL
  Administered 2014-03-12: 8 mL

## 2014-03-12 MED ORDER — LACTATED RINGERS IV SOLN
INTRAVENOUS | Status: DC
  Administered 2014-03-12: 10:00:00 via INTRAVENOUS

## 2014-03-12 MED ORDER — OXYCODONE-ACETAMINOPHEN 5-325 MG PO TABS
1.0000 | ORAL_TABLET | ORAL | Status: DC | PRN
  Filled 2014-03-12: qty 1

## 2014-03-12 MED ORDER — TETANUS-DIPHTH-ACELL PERTUSSIS 5-2.5-18.5 LF-MCG/0.5 IM SUSP: 0.5000 mL | Freq: Once | INTRAMUSCULAR | Status: DC

## 2014-03-12 MED ORDER — LIDOCAINE HCL (PF) 1 % IJ SOLN
30.0000 mL | INTRAMUSCULAR | Status: DC | PRN
  Filled 2014-03-12: qty 30

## 2014-03-12 MED ORDER — EPHEDRINE 5 MG/ML INJ
10.0000 mg | INTRAVENOUS | Status: DC | PRN
  Filled 2014-03-12: qty 2

## 2014-03-12 NOTE — Anesthesia Procedure Notes (Signed)
Epidural Patient location during procedure: OB Start time: 03/12/2014 10:06 AM End time: 03/12/2014 10:10 AM  Staffing Anesthesiologist: Leilani AbleHATCHETT, Anay Rathe Performed by: anesthesiologist   Preanesthetic Checklist Completed: patient identified, surgical consent, pre-op evaluation, timeout performed, IV checked, risks and benefits discussed and monitors and equipment checked  Epidural Patient position: sitting Prep: site prepped and draped and DuraPrep Patient monitoring: continuous pulse ox and blood pressure Approach: midline Location: L3-L4 Injection technique: LOR air  Needle:  Needle type: Tuohy  Needle gauge: 17 G Needle length: 9 cm and 9 Needle insertion depth: 6 cm Catheter type: closed end flexible Catheter size: 19 Gauge Catheter at skin depth: 11 cm Test dose: negative and Other  Assessment Sensory level: T9 Events: blood not aspirated, injection not painful, no injection resistance, negative IV test and no paresthesia  Additional Notes Reason for block:procedure for pain

## 2014-03-12 NOTE — H&P (Signed)
Attestation of Attending Supervision of Advanced Practitioner (CNM/NP): Evaluation and management procedures were performed by the Advanced Practitioner under my supervision and collaboration. I have reviewed the Advanced Practitioner's note and chart, and I agree with the management and plan.  Lesly DukesKelly H Shyann Hefner 6:52 AM

## 2014-03-12 NOTE — Progress Notes (Addendum)
Patient ID: Gilman Buttneriamond I Davoli, female   DOB: 1996-08-25, 18 y.o.   MRN: [id number] Gilman ButtnerDiamond I Norlander is a 18 y.o. G1P0 at 5054w6d  admitted for induction of labor due to oligohydramnios as incidental finding when here for r/o labor.  Subjective: Cramping, has received fentanyl which helps some  Objective: BP 109/56  Pulse 84  Temp(Src) 98.2 F (36.8 C) (Oral)  Resp 18  Ht 5\' 3"  (1.6 m)  Wt 60.782 kg (134 lb)  BMI 23.74 kg/m2  SpO2 97%  LMP 06/06/2013    FHT:  FHR: 140 bpm, variability: moderate,  accelerations:  Present,  decelerations:  Present occ variables UC:   Occ, irregular  SVE:  1/80/-3 @ 0011 when foley bulb placed- doesn't budge when tugged on   Labs: Lab Results  Component Value Date   WBC 11.9* 03/11/2014   HGB 13.3 03/11/2014   HCT 38.4 03/11/2014   MCV 94.8 03/11/2014   PLT 198 03/11/2014    Assessment / Plan: IOL d/t oligohydramnios, cervical foley bulb still in, plan for pitocin when it falls out.   Dr. Penne LashLeggett aware of occ variables, reviewed strip.   Labor: n/a Fetal Wellbeing:  Category II Pain Control:  Fentanyl Pre-eclampsia: n/a I/D:  n/a Anticipated MOD:  NSVD  Cheron EveryKimberly Randall Booker CNM, WHNP-BC 03/12/2014, 7:09 AM

## 2014-03-12 NOTE — Progress Notes (Signed)
Pt requesting pain med. Dr. Ike Benedom notified. Reviewed tracing. Will give pt Hydroxyzine 50mg  po for now. Pt is anxious.

## 2014-03-12 NOTE — Progress Notes (Signed)
Cindy Ware is a 18 y.o. G1P0 at 362w6d admitted for oligohydramnios  Subjective: Pt comfortable with epidural. No complaints  Objective: BP 106/64  Pulse 73  Temp(Src) 98 F (36.7 C) (Oral)  Resp 18  Ht 5\' 3"  (1.6 m)  Wt 60.782 kg (134 lb)  BMI 23.74 kg/m2  SpO2 100%  LMP 06/06/2013      FHT:  FHR: 120s bpm, variability: moderate,  accelerations:  Present,  decelerations:  Present occassional variable decel UC:   regular, every 3 minutes SVE:   Dilation: Lip/rim Effacement (%): 100 Station: +1 Exam by:: Goodrich CorporationSmith RN  Labs: Lab Results  Component Value Date   WBC 11.9* 03/11/2014   HGB 13.3 03/11/2014   HCT 38.4 03/11/2014   MCV 94.8 03/11/2014   PLT 198 03/11/2014    Assessment / Plan: Induction of labor due to oligohydramnios,  progressing well without pitocin  Labor: Progressing normally and feeling urge to push Preeclampsia:  no signs or symptoms of toxicity Fetal Wellbeing:  Category II Pain Control:  Epidural I/D:  n/a Anticipated MOD:  NSVD  Cindy Ware 03/12/2014, 11:03 AM

## 2014-03-12 NOTE — Anesthesia Preprocedure Evaluation (Signed)

## 2014-03-13 MED ORDER — RHO D IMMUNE GLOBULIN 1500 UNIT/2ML IJ SOLN
300.0000 ug | Freq: Once | INTRAMUSCULAR | Status: AC
  Administered 2014-03-13: 300 ug via INTRAMUSCULAR
  Filled 2014-03-13: qty 2

## 2014-03-13 MED ORDER — IBUPROFEN 600 MG PO TABS: 600.0000 mg | ORAL_TABLET | Freq: Four times a day (QID) | ORAL | Status: DC

## 2014-03-13 NOTE — Discharge Summary (Signed)
Attestation of Attending Supervision of Fellow: Evaluation and management procedures were performed by the Fellow under my supervision and collaboration.  I have reviewed the Fellow's note and chart, and I agree with the management and plan.    

## 2014-03-13 NOTE — Progress Notes (Signed)
CSW acknowledges consult for "17yo mom, first baby, h/o marijuana usage prior to pregnancy. Mom wanting early d/c."  CSW called MD to request more information/specific concerns as these are not typically reasons for an automatic CSW referral.  CSW also contacted MOB's bedside RN.  With no additional concerns and bedside RN stating appropriate behavior and good support, CSW will screen out referral at this time.  CSW requested of RN that she contact CSW if any issues or concerns arise this morning prior to d/c and CSW will meet with patient immediately.  

## 2014-03-13 NOTE — Discharge Instructions (Signed)

## 2014-03-13 NOTE — Discharge Summary (Signed)
Obstetric Discharge Summary Reason for Admission: induction of labor for oligo Prenatal Procedures: NST Intrapartum Procedures: spontaneous vaginal delivery Postpartum Procedures: none Complications-Operative and Postpartum: 2nd degree perineal laceration Hemoglobin  Date Value Ref Range Status  03/11/2014 13.3  12.0 - 15.0 g/dL Final     HCT  Date Value Ref Range Status  03/11/2014 38.4  36.0 - 46.0 % Final   Delivery Note At 12:01 PM a viable female was delivered via Vaginal, Spontaneous Delivery (Presentation: Left Occiput Anterior).  APGAR: 8, 9; weight 6 lb 3.5 oz (2820 g).   Placenta status: Intact, Spontaneous.  Cord: 3 vessels with the following complications: .  Cord pH: NA  Anesthesia: Epidural  Episiotomy: None Lacerations: 2nd degree Suture Repair: 3.0 monocryl Est. Blood Loss (mL): 300  Mom to postpartum.  Baby to Couplet care / Skin to Skin.  Called to delivery. Mother pushed over 2nd degree tear. Infant delivered to maternal abdomen. Cord clamped and cut. Active management of 3rd stage with traction. Placenta delivered intact with 3v cord and pit was started. Tear repaired with 3.0 monocryl on CT in usual manner. EBL300. Counts correct. Hemostatic.   Cindy ScaleMichael Ryan Odom, MD OB Fellow    Cindy BalsamMichael R Ware 03/12/2014, 2:53 PM   Physical Exam:  General: alert, cooperative and no distress Lochia: appropriate Uterine Fundus: firm Incision: na DVT Evaluation: No cords or calf tenderness. No significant calf/ankle edema.  Discharge Diagnoses: Term Pregnancy-delivered  Discharge Information: Date: 03/13/2014 Activity: pelvic rest Diet: routine Medications: PNV and Ibuprofen Condition: stable Instructions: refer to practice specific booklet Discharge to: home Follow-up Information   [facility] In 5 weeks.   Specialty:  Obstetrics and Gynecology   Contact information:   [address] [place] [address] [phone]       Newborn Data: Live born female  Birth Weight: 6 lb 3.5 oz (2820 g) APGAR: 8, 9  Home with mother.  Bottle feeding Depo for contraception- [facility].   Cindy Ware 03/13/2014, 7:08 AM

## 2014-03-13 NOTE — Plan of Care (Signed)
Problem: Discharge Progression Outcomes Goal: MMR given as ordered Outcome: Not Met (add Reason) Needs MMR ( non immune)

## 2014-03-13 NOTE — Progress Notes (Signed)
Ur chart review completed.  

## 2014-03-13 NOTE — Anesthesia Postprocedure Evaluation (Signed)
Anesthesia Post Note  Patient: Cindy Ware  Procedure(s) Performed: * No procedures listed *  Anesthesia type: Epidural  Patient location: Mother/Baby  Post pain: Pain level controlled  Post assessment: Post-op Vital signs reviewed  Last Vitals:  Filed Vitals:   03/13/14 0600  BP: 96/62  Pulse: 81  Temp: 36.3 C  Resp: 16    Post vital signs: Reviewed  Level of consciousness:alert  Complications: No apparent anesthesia complications

## 2014-03-14 LAB — RH IG WORKUP (INCLUDES ABO/RH)
ABO/RH(D): A NEG
Fetal Screen: NEGATIVE
Gestational Age(Wks): 39.6
UNIT DIVISION: 0

## 2014-03-15 ENCOUNTER — Ambulatory Visit (INDEPENDENT_AMBULATORY_CARE_PROVIDER_SITE_OTHER): Payer: Medicaid Other

## 2014-03-15 ENCOUNTER — Other Ambulatory Visit: Payer: Medicaid Other

## 2014-03-15 VITALS — BP 109/72 | HR 90 | Temp 97.5°F | Ht 63.0 in | Wt 127.3 lb

## 2014-03-15 DIAGNOSIS — Z3049 Encounter for surveillance of other contraceptives: Secondary | ICD-10-CM

## 2014-03-15 MED ORDER — MEDROXYPROGESTERONE ACETATE 104 MG/0.65ML ~~LOC~~ SUSP
104.0000 mg | Freq: Once | SUBCUTANEOUS | Status: AC
  Administered 2014-03-15: 104 mg via SUBCUTANEOUS

## 2014-03-15 NOTE — Progress Notes (Signed)
Pt. Delivered 4/18. Here today for depo-provera injection for contraception. Depo provera 104mg  administered subcutaneously. Pt. Tolerated well. Pt. To return between 7/14-7/22 for second injection.

## 2014-04-11 ENCOUNTER — Encounter: Payer: Self-pay | Admitting: Family Medicine

## 2014-04-11 ENCOUNTER — Ambulatory Visit (INDEPENDENT_AMBULATORY_CARE_PROVIDER_SITE_OTHER): Payer: Medicaid Other | Admitting: Family Medicine

## 2014-04-11 DIAGNOSIS — O4103X0 Oligohydramnios, third trimester, not applicable or unspecified: Secondary | ICD-10-CM

## 2014-04-11 DIAGNOSIS — O09893 Supervision of other high risk pregnancies, third trimester: Secondary | ICD-10-CM

## 2014-04-11 DIAGNOSIS — Z3400 Encounter for supervision of normal first pregnancy, unspecified trimester: Secondary | ICD-10-CM

## 2014-04-11 NOTE — Patient Instructions (Signed)

## 2014-04-11 NOTE — Progress Notes (Signed)
Patient ID: Cindy Ware, female   DOB: 03/25/1996, 18 y.o.   MRN: [id number] Subjective:    Cindy Ware is a 18 y.o. [id number] African American female who presents for a postpartum visit. She is 5 weeks postpartum following a spontaneous vaginal delivery. I have fully reviewed the prenatal and intrapartum course. The delivery was at term. Outcome: spontaneous vaginal delivery and with 2nd degree tear. Anesthesia: epidural. Postpartum course has been uncomplicated. Baby's course has been uncomplicated. Baby is feeding by bottle - gerber good start. Bleeding staining only. Bowel function is normal. Bladder function is normal. Patient is not sexually active. Contraception method is Depo-Provera injections. Postpartum depression screening: negative.  The following portions of the patient's history were reviewed and updated as appropriate: allergies, current medications, past medical history, past surgical history and problem list.  Review of Systems Pertinent items are noted in HPI.   Filed Vitals:   04/11/14 1558  BP: 120/76  Pulse: 93  Temp: 98.1 F (36.7 C)  TempSrc: Oral  Height: 5\' 5"  (1.651 m)  Weight: 51.665 kg (113 lb 14.4 oz)    Objective:     General:  alert, cooperative and no distress   Breasts:  deferred, no complaints  Lungs: clear to auscultation bilaterally  Heart:  regular rate and rhythm  Abdomen: soft, nontender   Vulva: normal  Vagina: normal vagina  Cervix:  closed  Corpus: Well-involuted  Adnexa:  Non-palpable  Rectal Exam: no hemorrhoids        Assessment:   normal postpartum exam 5 wks s/p SVD Depression screening Contraception counseling   Plan:   Contraception: Depo-Provera injections Follow up in: 1 year  for routine gyn exam or as needed.

## 2014-06-06 ENCOUNTER — Ambulatory Visit (INDEPENDENT_AMBULATORY_CARE_PROVIDER_SITE_OTHER): Payer: Medicaid Other | Admitting: *Deleted

## 2014-06-06 VITALS — BP 106/72 | HR 84

## 2014-06-06 DIAGNOSIS — Z3049 Encounter for surveillance of other contraceptives: Secondary | ICD-10-CM

## 2014-06-06 MED ORDER — MEDROXYPROGESTERONE ACETATE 104 MG/0.65ML ~~LOC~~ SUSP
104.0000 mg | Freq: Once | SUBCUTANEOUS | Status: AC
  Administered 2014-06-06: 104 mg via SUBCUTANEOUS

## 2014-08-30 ENCOUNTER — Ambulatory Visit: Payer: Medicaid Other

## 2014-08-30 ENCOUNTER — Ambulatory Visit (INDEPENDENT_AMBULATORY_CARE_PROVIDER_SITE_OTHER): Payer: Medicaid Other | Admitting: General Practice

## 2014-08-30 VITALS — BP 105/63 | HR 82 | Temp 98.2°F | Ht 63.0 in | Wt 107.6 lb

## 2014-08-30 DIAGNOSIS — Z3042 Encounter for surveillance of injectable contraceptive: Secondary | ICD-10-CM

## 2014-08-30 MED ORDER — MEDROXYPROGESTERONE ACETATE 104 MG/0.65ML ~~LOC~~ SUSP
104.0000 mg | Freq: Once | SUBCUTANEOUS | Status: AC
  Administered 2014-08-30: 104 mg via SUBCUTANEOUS

## 2014-09-08 ENCOUNTER — Other Ambulatory Visit: Payer: Self-pay

## 2014-09-25 ENCOUNTER — Encounter: Payer: Self-pay | Admitting: Family Medicine

## 2014-11-22 ENCOUNTER — Ambulatory Visit (INDEPENDENT_AMBULATORY_CARE_PROVIDER_SITE_OTHER): Payer: Medicaid Other

## 2014-11-22 ENCOUNTER — Ambulatory Visit: Payer: Medicaid Other

## 2014-11-22 VITALS — BP 107/63 | HR 70 | Wt 103.2 lb

## 2014-11-22 DIAGNOSIS — Z3042 Encounter for surveillance of injectable contraceptive: Secondary | ICD-10-CM

## 2014-11-22 DIAGNOSIS — Z3049 Encounter for surveillance of other contraceptives: Secondary | ICD-10-CM

## 2014-11-22 MED ORDER — MEDROXYPROGESTERONE ACETATE 104 MG/0.65ML ~~LOC~~ SUSP
104.0000 mg | Freq: Once | SUBCUTANEOUS | Status: AC
  Administered 2014-11-22: 104 mg via SUBCUTANEOUS

## 2015-02-12 ENCOUNTER — Ambulatory Visit (INDEPENDENT_AMBULATORY_CARE_PROVIDER_SITE_OTHER): Payer: Medicaid Other | Admitting: *Deleted

## 2015-02-12 VITALS — BP 111/61 | HR 79

## 2015-02-12 DIAGNOSIS — Z3042 Encounter for surveillance of injectable contraceptive: Secondary | ICD-10-CM

## 2015-02-12 MED ORDER — MEDROXYPROGESTERONE ACETATE 104 MG/0.65ML ~~LOC~~ SUSP
104.0000 mg | Freq: Once | SUBCUTANEOUS | Status: AC
  Administered 2015-02-12: 104 mg via SUBCUTANEOUS

## 2015-04-04 ENCOUNTER — Telehealth: Payer: Self-pay | Admitting: Family

## 2015-04-04 DIAGNOSIS — N390 Urinary tract infection, site not specified: Secondary | ICD-10-CM

## 2015-04-04 MED ORDER — SULFAMETHOXAZOLE-TRIMETHOPRIM 800-160 MG PO TABS: 1.0000 | ORAL_TABLET | Freq: Two times a day (BID) | ORAL | Status: DC

## 2015-04-04 NOTE — Progress Notes (Signed)

## 2015-05-07 ENCOUNTER — Ambulatory Visit (INDEPENDENT_AMBULATORY_CARE_PROVIDER_SITE_OTHER): Payer: Medicaid Other | Admitting: *Deleted

## 2015-05-07 VITALS — BP 111/67 | HR 68 | Temp 98.1°F | Wt 99.7 lb

## 2015-05-07 DIAGNOSIS — Z3042 Encounter for surveillance of injectable contraceptive: Secondary | ICD-10-CM

## 2015-05-07 MED ORDER — MEDROXYPROGESTERONE ACETATE 150 MG/ML IM SUSP
150.0000 mg | Freq: Once | INTRAMUSCULAR | Status: AC
  Administered 2015-05-07: 150 mg via INTRAMUSCULAR

## 2015-05-07 NOTE — Addendum Note (Signed)
Addended by: Candelaria Stagers E on: 05/07/2015 10:53 AM   Modules accepted: Level of Service

## 2015-06-26 ENCOUNTER — Encounter (HOSPITAL_COMMUNITY): Payer: Self-pay

## 2015-06-26 ENCOUNTER — Emergency Department (HOSPITAL_COMMUNITY)
Admission: EM | Admit: 2015-06-26 | Discharge: 2015-06-26 | Disposition: A | Discharge status: Home / Self Care | Payer: Medicaid Other | Attending: Physician Assistant | Admitting: Physician Assistant

## 2015-06-26 DIAGNOSIS — R21 Rash and other nonspecific skin eruption: Secondary | ICD-10-CM | POA: Diagnosis present

## 2015-06-26 DIAGNOSIS — Z792 Long term (current) use of antibiotics: Secondary | ICD-10-CM | POA: Insufficient documentation

## 2015-06-26 DIAGNOSIS — L509 Urticaria, unspecified: Secondary | ICD-10-CM

## 2015-06-26 MED ORDER — PREDNISONE 20 MG PO TABS: 40.0000 mg | ORAL_TABLET | Freq: Every day | ORAL | Status: DC

## 2015-06-26 NOTE — Discharge Instructions (Signed)
Hives Hives are itchy, red, swollen areas of the skin. They can vary in size and location on your body. Hives can come and go for hours or several days (acute hives) or for several weeks (chronic hives). Hives do not spread from person to person (noncontagious). They may get worse with scratching, exercise, and emotional stress. CAUSES   Allergic reaction to food, additives, or drugs.  Infections, including the common cold.  Illness, such as vasculitis, lupus, or thyroid disease.  Exposure to sunlight, heat, or cold.  Exercise.  Stress.  Contact with chemicals. SYMPTOMS   Red or white swollen patches on the skin. The patches may change size, shape, and location quickly and repeatedly.  Itching.  Swelling of the hands, feet, and face. This may occur if hives develop deeper in the skin. DIAGNOSIS  Your caregiver can usually tell what is wrong by performing a physical exam. Skin or blood tests may also be done to determine the cause of your hives. In some cases, the cause cannot be determined. TREATMENT  Mild cases usually get better with medicines such as antihistamines. Severe cases may require an emergency epinephrine injection. If the cause of your hives is known, treatment includes avoiding that trigger.  HOME CARE INSTRUCTIONS   Avoid causes that trigger your hives.  Take antihistamines as directed by your caregiver to reduce the severity of your hives. Non-sedating or low-sedating antihistamines are usually recommended. Do not drive while taking an antihistamine.  Take any other medicines prescribed for itching as directed by your caregiver.  Wear loose-fitting clothing.  Keep all follow-up appointments as directed by your caregiver. SEEK MEDICAL CARE IF:   You have persistent or severe itching that is not relieved with medicine.  You have painful or swollen joints. SEEK IMMEDIATE MEDICAL CARE IF:   You have a fever.  Your tongue or lips are swollen.  You have  trouble breathing or swallowing.  You feel tightness in the throat or chest.  You have abdominal pain. These problems may be the first sign of a life-threatening allergic reaction. Call your local emergency services (911 in [place] MAKE SURE YOU:   Understand these instructions.  Will watch your condition.  Will get help right away if you are not doing well or get worse. Document Released: 11/10/2005 Document Revised: 11/15/2013 Document Reviewed: 02/03/2012 ExitCare Patient Information 2015 [employer]. This information is not intended to replace advice given to you by your health care provider. Make sure you discuss any questions you have with your health care provider.  

## 2015-06-26 NOTE — ED Provider Notes (Signed)
CSN: [id number]     Arrival date & time 06/26/15  1417 History  This chart was scribed for non-physician practitioner, Roxy Horseman, PA-C working with Abelino Derrick, MD, by Jarvis Morgan, ED Scribe. This patient was seen in room WTR7/WTR7 and the patient's care was started at 2:51 PM      Chief Complaint  Patient presents with  . Rash    The history is provided by the patient. No language interpreter was used.    Cindy Ware is a 19 y.o. female who presents to the Emergency Department with a chief complaint of an intermittent, red, itchy, generalized rash onset 5 days. Pt states 4-5 days ago she ate pretzels and applesauce and her lip went numb and tongue went numb and she developed hives.  She states she has had this rash intermittently for the past 5 days. She has been taking Benadryl with mild relief. Pt states she has been taking Benadryl 2x a day. She denies using any new laundry detergents or new soaps. She states she does not have any of the symptoms at this time. She denies any known allergies   Past Medical History  Diagnosis Date  . Medical history non-contributory    Past Surgical History  Procedure Laterality Date  . No past surgeries     History reviewed. No pertinent family history. History  Substance Use Topics  . Smoking status: Never Smoker   . Smokeless tobacco: Never Used  . Alcohol Use: No   OB History    Gravida Para Term Preterm AB TAB SAB Ectopic Multiple Living   Review of Systems  HENT: Negative for facial swelling and trouble swallowing.   Respiratory: Negative for wheezing.   Skin: Positive for rash.      Allergies  Review of patient's allergies indicates no known allergies.  Home Medications   Prior to Admission medications   Medication Sig Start Date End Date Taking? Authorizing Provider  ibuprofen (ADVIL,MOTRIN) 600 MG tablet Take 1 tablet (600 mg total) by mouth every 6 (six) hours. 03/13/14   Vale Haven, MD  sulfamethoxazole-trimethoprim (BACTRIM DS,SEPTRA DS) 800-160 MG per tablet Take 1 tablet by mouth 2 (two) times daily. 04/04/15   Junie Spencer, FNP   Triage Vitals: BP 111/54 mmHg  Pulse 88  Temp(Src) 98.5 F (36.9 C) (Oral)  Resp 17  SpO2 100%  Physical Exam  Constitutional: She is oriented to person, place, and time. She appears well-developed and well-nourished. No distress.  HENT:  Head: Normocephalic and atraumatic.  Oropharynx is clear, no swelling, no stridor, airway intact  Eyes: Conjunctivae and EOM are normal.  Neck: Neck supple. No tracheal deviation present.  Cardiovascular: Normal rate, regular rhythm and normal heart sounds.  Exam reveals no gallop and no friction rub.   No murmur heard. Pulmonary/Chest: Effort normal and breath sounds normal. No respiratory distress. She has no wheezes. She has no rales. She exhibits no tenderness.  CTAB  Musculoskeletal: Normal range of motion.  Neurological: She is alert and oriented to person, place, and time.  Skin: Skin is warm and dry. No rash noted.  Viewed pictures that were characteristic of hives  Psychiatric: She has a normal mood and affect. Her behavior is normal.  Nursing note and vitals reviewed.   ED Course  Procedures (including critical care time)  DIAGNOSTIC STUDIES: Oxygen Saturation is 100% on RA, normal by my interpretation.  COORDINATION OF CARE: 3:01 PM- Will d/c pt home with prednisone. Advised to f/u with allergist.  Pt advised of plan for treatment and pt agrees.     Labs Review Labs Reviewed - No data to display  Imaging Review No results found.   EKG Interpretation None      MDM   Final diagnoses:  Hives    Patient with allergic reaction 4-5 days. She states that she has been breaking out in hives.She has been taking Benadryl with good relief. She attributes the symptoms to eating applesauce. She has no other known allergies. Will prescribe prednisone and recommend  allergist follow-up.  I personally performed the services described in this documentation, which was scribed in my presence. The recorded information has been reviewed and is accurate.      Roxy Horseman, PA-C 06/26/15 1505  Courteney Randall An, MD 06/27/15 [phone]

## 2015-06-26 NOTE — ED Notes (Signed)
Pt states allergic reaction/rash x 4-5 days.  States rash over body.  Took benadryl today prior to coming in.  States rash goes away with benadryl but comes right back.  On Thursday, noted some changes to lip and tongue.  States happens on/off but not happening now.  No new products.

## 2015-07-23 ENCOUNTER — Ambulatory Visit (INDEPENDENT_AMBULATORY_CARE_PROVIDER_SITE_OTHER): Payer: Medicaid Other

## 2015-08-01 ENCOUNTER — Ambulatory Visit: Payer: Medicaid Other

## 2015-08-06 ENCOUNTER — Ambulatory Visit: Payer: Medicaid Other

## 2015-10-03 ENCOUNTER — Ambulatory Visit: Payer: Medicaid Other

## 2015-10-08 ENCOUNTER — Ambulatory Visit: Payer: Medicaid Other

## 2015-10-10 ENCOUNTER — Ambulatory Visit: Payer: Medicaid Other

## 2015-10-15 ENCOUNTER — Ambulatory Visit (INDEPENDENT_AMBULATORY_CARE_PROVIDER_SITE_OTHER): Payer: Medicaid Other

## 2015-10-15 VITALS — BP 112/69 | HR 71 | Temp 97.8°F | Resp 18 | Wt 107.4 lb

## 2015-10-15 DIAGNOSIS — Z3042 Encounter for surveillance of injectable contraceptive: Secondary | ICD-10-CM | POA: Diagnosis not present

## 2015-10-15 LAB — POCT URINALYSIS DIP (DEVICE)
BILIRUBIN URINE: NEGATIVE
Glucose, UA: NEGATIVE mg/dL
Ketones, ur: NEGATIVE mg/dL
Leukocytes, UA: NEGATIVE
Nitrite: NEGATIVE
PH: 6.5 (ref 5.0–8.0)
Protein, ur: NEGATIVE mg/dL
Specific Gravity, Urine: 1.025 (ref 1.005–1.030)
Urobilinogen, UA: 0.2 mg/dL (ref 0.0–1.0)

## 2015-10-15 LAB — POCT PREGNANCY, URINE: PREG TEST UR: NEGATIVE

## 2015-10-15 MED ORDER — MEDROXYPROGESTERONE ACETATE 150 MG/ML IM SUSP
150.0000 mg | Freq: Once | INTRAMUSCULAR | Status: AC
  Administered 2015-10-15: 150 mg via INTRAMUSCULAR

## 2015-10-15 NOTE — Progress Notes (Signed)
Patient ID: Cindy Ware, female   DOB: 03/31/1996, 19 y.o.   MRN: [id number] Pt to return Feb 6-Feb21st for next depo injection

## 2015-10-15 NOTE — Patient Instructions (Signed)
Pt to return Feb6-21 for Depo Injection

## 2015-11-09 ENCOUNTER — Ambulatory Visit: Payer: Medicaid Other | Admitting: Obstetrics & Gynecology

## 2015-11-24 ENCOUNTER — Encounter: Payer: Self-pay | Admitting: Obstetrics & Gynecology

## 2015-11-24 ENCOUNTER — Emergency Department (HOSPITAL_COMMUNITY)
Admission: EM | Admit: 2015-11-24 | Discharge: 2015-11-24 | Disposition: A | Discharge status: Home / Self Care | Payer: Medicaid Other | Attending: Emergency Medicine | Admitting: Emergency Medicine

## 2015-11-24 ENCOUNTER — Encounter (HOSPITAL_COMMUNITY): Payer: Self-pay | Admitting: Emergency Medicine

## 2015-11-24 DIAGNOSIS — Z3202 Encounter for pregnancy test, result negative: Secondary | ICD-10-CM | POA: Insufficient documentation

## 2015-11-24 DIAGNOSIS — R350 Frequency of micturition: Secondary | ICD-10-CM

## 2015-11-24 DIAGNOSIS — R300 Dysuria: Secondary | ICD-10-CM | POA: Diagnosis present

## 2015-11-24 DIAGNOSIS — Z79899 Other long term (current) drug therapy: Secondary | ICD-10-CM | POA: Insufficient documentation

## 2015-11-24 DIAGNOSIS — N3001 Acute cystitis with hematuria: Secondary | ICD-10-CM

## 2015-11-24 LAB — URINALYSIS, ROUTINE W REFLEX MICROSCOPIC
Bilirubin Urine: NEGATIVE
GLUCOSE, UA: NEGATIVE mg/dL
KETONES UR: NEGATIVE mg/dL
Nitrite: NEGATIVE
PROTEIN: NEGATIVE mg/dL
Specific Gravity, Urine: 1.006 (ref 1.005–1.030)
pH: 7.5 (ref 5.0–8.0)

## 2015-11-24 LAB — URINE MICROSCOPIC-ADD ON: BACTERIA UA: NONE SEEN

## 2015-11-24 LAB — POC URINE PREG, ED: PREG TEST UR: NEGATIVE

## 2015-11-24 MED ORDER — PHENAZOPYRIDINE HCL 100 MG PO TABS: 100.0000 mg | ORAL_TABLET | Freq: Three times a day (TID) | ORAL | Status: DC

## 2015-11-24 MED ORDER — IBUPROFEN 200 MG PO TABS
600.0000 mg | ORAL_TABLET | Freq: Once | ORAL | Status: AC
  Administered 2015-11-24: 600 mg via ORAL
  Filled 2015-11-24: qty 3

## 2015-11-24 MED ORDER — PHENAZOPYRIDINE HCL 100 MG PO TABS
100.0000 mg | ORAL_TABLET | Freq: Once | ORAL | Status: AC
  Administered 2015-11-24: 100 mg via ORAL
  Filled 2015-11-24: qty 1

## 2015-11-24 MED ORDER — CEPHALEXIN 500 MG PO CAPS: 500.0000 mg | ORAL_CAPSULE | Freq: Two times a day (BID) | ORAL | Status: DC

## 2015-11-24 MED ORDER — PHENAZOPYRIDINE HCL 200 MG PO TABS: 200.0000 mg | ORAL_TABLET | Freq: Three times a day (TID) | ORAL | Status: DC

## 2015-11-24 NOTE — ED Provider Notes (Signed)
CSN: [id number]     Arrival date & time 11/24/15  1437 History   First MD Initiated Contact with Patient 11/24/15 1505     Chief Complaint  Patient presents with  . Dysuria     (Consider location/radiation/quality/duration/timing/severity/associated sxs/prior Treatment) HPI 19 year old female who presents with dysuria. History of recurrent UTIs.  Has been experiencing urinary frequency 2 days ago with dark and cloudy urine. Drinking more water and with clearing of urine. Still with frequency. With suprapubic pain, no flank pain. States this is same as prior episodes of UTI. No nausea, vomiting, diarrhea, fever, vaginal bleeding or discharge.   Past Medical History  Diagnosis Date  . Medical history non-contributory    Past Surgical History  Procedure Laterality Date  . No past surgeries     History reviewed. No pertinent family history. Social History  Substance Use Topics  . Smoking status: Never Smoker   . Smokeless tobacco: Never Used  . Alcohol Use: No   OB History    Gravida Para Term Preterm AB TAB SAB Ectopic Multiple Living   1 1 1       1      Review of Systems 10/14 systems reviewed and are negative other than those stated in the HPI    Allergies  Review of patient's allergies indicates no known allergies.  Home Medications   Prior to Admission medications   Medication Sig Start Date End Date Taking? Authorizing Provider  medroxyPROGESTERone (DEPO-PROVERA) 150 MG/ML injection Inject 150 mg into the muscle every 3 (three) months.   Yes Historical Provider, MD  cephALEXin (KEFLEX) 500 MG capsule Take 1 capsule (500 mg total) by mouth 2 (two) times daily. 11/24/15   Lavera Guiseana Duo Givanni Staron, MD  phenazopyridine (PYRIDIUM) 200 MG tablet Take 1 tablet (200 mg total) by mouth 3 (three) times daily. 11/24/15   Lavera Guiseana Duo Joscelyne Renville, MD   BP 110/62 mmHg  Pulse 91  Temp(Src) 98.1 F (36.7 C) (Oral)  Resp 18  SpO2 100% Physical Exam Physical Exam  Nursing note and vitals  reviewed. Constitutional: Well developed, well nourished, non-toxic, and in no acute distress Head: Normocephalic and atraumatic.  Mouth/Throat: Oropharynx is clear and moist.  Neck: Normal range of motion. Neck supple.  Cardiovascular: Normal rate and regular rhythm.   Pulmonary/Chest: Effort normal and breath sounds normal.  Abdominal: Soft. There is suprapubic tenderness. There is no rebound and no guarding. No CVA tenderness.  Musculoskeletal: Normal range of motion.  Neurological: Alert, no facial droop, fluent speech, moves all extremities symmetrically Skin: Skin is warm and dry.  Psychiatric: Cooperative  ED Course  Procedures (including critical care time) Labs Review Labs Reviewed  URINALYSIS, ROUTINE W REFLEX MICROSCOPIC (NOT AT [facility]) - Abnormal; Notable for the following:    Hgb urine dipstick MODERATE (*)    Leukocytes, UA SMALL (*)    All other components within normal limits  URINE MICROSCOPIC-ADD ON - Abnormal; Notable for the following:    Squamous Epithelial / LPF 0-5 (*)    All other components within normal limits  URINE CULTURE  POC URINE PREG, ED    Imaging Review No results found. I have personally reviewed and evaluated these images and lab results as part of my medical decision-making.   EKG Interpretation None      MDM   Final diagnoses:  Urinary frequency  Acute cystitis with hematuria    19 year old female with frequent UTIs who presents with urinary frequency and suprapubic pain c/w  prior episodes of UTI. Not pregnant. Soft abdomen, benign, with suprapubic pain. No flank tenderness. No pelvic symptoms. No concern for pelvic pathology or pyelonephritis. UA with leukocytes, 30 WBCs, and given symptoms the same as prior UTI will treat with kelfex. Discussed return precautions. Strict return and follow-up instructions reviewed. She expressed understanding of all discharge instructions and felt comfortable with the plan of care.     Lavera Guise, MD 11/24/15 [phone]

## 2015-11-24 NOTE — ED Notes (Signed)
Pt complaint of dysuria and frequency; hx of UTI.

## 2015-11-24 NOTE — Discharge Instructions (Signed)
Return without fail for worsening symptoms, including fever, vomiting and unable to keep down food/fluids, severe back pain, worsening abdominal pain, or any other symptoms concerning to you.  Urinary Tract Infection A urinary tract infection (UTI) can occur any place along the urinary tract. The tract includes the kidneys, ureters, bladder, and urethra. A type of germ called bacteria often causes a UTI. UTIs are often helped with antibiotic medicine.  HOME CARE   If given, take antibiotics as told by your doctor. Finish them even if you start to feel better.  Drink enough fluids to keep your pee (urine) clear or pale yellow.  Avoid tea, drinks with caffeine, and bubbly (carbonated) drinks.  Pee often. Avoid holding your pee in for a long time.  Pee before and after having sex (intercourse).  Wipe from front to back after you poop (bowel movement) if you are a woman. Use each tissue only once. GET HELP RIGHT AWAY IF:   You have back pain.  You have lower belly (abdominal) pain.  You have chills.  You feel sick to your stomach (nauseous).  You throw up (vomit).  Your burning or discomfort with peeing does not go away.  You have a fever.  Your symptoms are not better in 3 days. MAKE SURE YOU:   Understand these instructions.  Will watch your condition.  Will get help right away if you are not doing well or get worse.   This information is not intended to replace advice given to you by your health care provider. Make sure you discuss any questions you have with your health care provider.   Document Released: 04/28/2008 Document Revised: 12/01/2014 Document Reviewed: 06/10/2012 Elsevier Interactive Patient Education [employer] Elsevier Inc.

## 2015-11-27 LAB — URINE CULTURE

## 2015-11-28 ENCOUNTER — Telehealth (HOSPITAL_BASED_OUTPATIENT_CLINIC_OR_DEPARTMENT_OTHER): Payer: Self-pay | Admitting: Emergency Medicine

## 2015-11-28 NOTE — Telephone Encounter (Signed)
Post ED Visit - Positive Culture Follow-up  Culture report reviewed by antimicrobial stewardship pharmacist:  []  Cindy Ware, Pharm.D. []  Cindy Ware, Pharm.D., BCPS []  Cindy Ware, Pharm.D. []  Cindy Ware, [address] BCPS []  Cindy Ware, [address] BCPS, AAHIVP []  Cindy Ware, Pharm.D., BCPS, AAHIVP [x]  Cindy Ware, Pharm.D. []  Cindy Ware, [address]  Positive urine culture E. coli Treated with cephalexin, organism sensitive to the same and no further patient follow-up is required at this time.  Cindy Ware, Cindy Ware 11/28/2015, 9:32 AM

## 2015-12-31 ENCOUNTER — Ambulatory Visit: Payer: Medicaid Other

## 2016-01-03 ENCOUNTER — Ambulatory Visit: Payer: Medicaid Other

## 2016-01-09 ENCOUNTER — Ambulatory Visit: Payer: Medicaid Other

## 2016-01-10 ENCOUNTER — Ambulatory Visit (INDEPENDENT_AMBULATORY_CARE_PROVIDER_SITE_OTHER): Payer: Medicaid Other

## 2016-01-10 VITALS — BP 106/73 | HR 82 | Temp 98.7°F | Wt 111.9 lb

## 2016-01-10 DIAGNOSIS — Z3042 Encounter for surveillance of injectable contraceptive: Secondary | ICD-10-CM | POA: Diagnosis present

## 2016-01-10 MED ORDER — MEDROXYPROGESTERONE ACETATE 150 MG/ML IM SUSP
150.0000 mg | Freq: Once | INTRAMUSCULAR | Status: AC
  Administered 2016-01-10: 150 mg via INTRAMUSCULAR

## 2016-01-10 NOTE — Progress Notes (Signed)
Pt received Depo-provera she will returned in three months.

## 2016-02-02 ENCOUNTER — Encounter: Payer: Self-pay | Admitting: Obstetrics & Gynecology

## 2016-02-13 ENCOUNTER — Encounter: Payer: Self-pay | Admitting: Obstetrics & Gynecology

## 2016-03-03 ENCOUNTER — Ambulatory Visit: Payer: Medicaid Other | Admitting: Obstetrics and Gynecology

## 2016-03-27 ENCOUNTER — Ambulatory Visit: Payer: Medicaid Other

## 2016-04-04 ENCOUNTER — Ambulatory Visit: Payer: Medicaid Other

## 2016-04-30 ENCOUNTER — Ambulatory Visit (INDEPENDENT_AMBULATORY_CARE_PROVIDER_SITE_OTHER): Payer: Medicaid Other

## 2016-04-30 DIAGNOSIS — Z3202 Encounter for pregnancy test, result negative: Secondary | ICD-10-CM

## 2016-04-30 DIAGNOSIS — Z3042 Encounter for surveillance of injectable contraceptive: Secondary | ICD-10-CM

## 2016-04-30 LAB — POCT PREGNANCY, URINE: PREG TEST UR: NEGATIVE

## 2016-04-30 NOTE — Progress Notes (Signed)
Patient ID: Cindy Ware, female   DOB: 1996-01-20, 20 y.o.   MRN: [id number] Pt reports to clinic for Depo injection, pts pregnancy test was NEG. Asked patient if she has had unprotected sex in the last 2 weeks and she reported that she HAS had unprotected sex. Explained to patient that because she has had unprotected sex that it was not safe to administer the injection at this time incase of possible pregnancy. Explained to patient the importance of abstaining from sex or using protection until next appt. Pt stated that "she needed the shot because she don't want any more children and her birthday is coming up on Monday." Pt stated that it was "bull shit" that we could not administer her injection at this time. Re-enforced the importance of why we can not administer the injection today and what she needs to do between now and returning for her next appt. Pt stormed out of office.

## 2016-05-14 ENCOUNTER — Ambulatory Visit: Payer: Medicaid Other

## 2016-07-04 ENCOUNTER — Encounter (HOSPITAL_COMMUNITY): Payer: Self-pay | Admitting: Emergency Medicine

## 2016-07-04 ENCOUNTER — Emergency Department (HOSPITAL_COMMUNITY)
Admission: EM | Admit: 2016-07-04 | Discharge: 2016-07-05 | Disposition: A | Discharge status: Home / Self Care | Payer: Medicaid Other | Attending: Emergency Medicine | Admitting: Emergency Medicine

## 2016-07-04 DIAGNOSIS — J069 Acute upper respiratory infection, unspecified: Secondary | ICD-10-CM

## 2016-07-04 DIAGNOSIS — J029 Acute pharyngitis, unspecified: Secondary | ICD-10-CM

## 2016-07-04 LAB — RAPID STREP SCREEN (MED CTR MEBANE ONLY): STREPTOCOCCUS, GROUP A SCREEN (DIRECT): NEGATIVE

## 2016-07-04 NOTE — ED Triage Notes (Signed)
Pt c/o bilat ear pain with sore throat onset yesterday

## 2016-07-04 NOTE — ED Notes (Signed)
Bed: [facility] Expected date:  Expected time:  Means of arrival:  Comments: dirttyyy

## 2016-07-05 NOTE — ED Notes (Signed)
Patient d/c'd self care.  F/U and discharge instructions reviewed.  Patient verbalized understanding.

## 2016-07-05 NOTE — ED Provider Notes (Signed)
[facility] DEPT Provider Note   CSN: [id number] Arrival date & time: 07/04/16  2256  First Provider Contact:  First MD Initiated Contact with Patient 07/05/16 0028     By signing my name below, I, Cindy Ware, attest that this documentation has been prepared under the direction and in the presence of Cindy Anis, PA-C. Electronically Signed: Linna Ware, Scribe. 07/05/2016. 12:28 AM.   History   Chief Complaint Chief Complaint  Patient presents with  . Sore Throat    The history is provided by the patient. No language interpreter was used.     HPI Comments: Cindy Ware is a 20 y.o. female with subjective PMHx of environmental allergies who presents to the Emergency Department complaining of sudden onset, constant, 8/10 sore throat beginning last night. She reports associated 8/10 bilateral ear pain. Pt also notes mild rhinorrhea and a headache. She states she has not measured her temperature but does not believe she has a fever. Pt reports her current symptoms are are different from and more severe than those that she has experienced due to environmental allergies. She denies sick contacts with similar symptoms. Pt further denies nausea, vomiting, cough, SOB, CP, abdominal pain, eye watering, eye itching, or any other associated symptoms.  Past Medical History:  Diagnosis Date  . Medical history non-contributory     Patient Active Problem List   Diagnosis Date Noted  . Oligohydramnios in third trimester, antepartum 03/12/2014  . High risk teen pregnancy in third trimester 03/01/2014  . Supervision of normal first pregnancy 08/17/2013    Past Surgical History:  Procedure Laterality Date  . NO PAST SURGERIES      OB History    Gravida Para Term Preterm AB Living   SAB TAB Ectopic Multiple Live Births           1       Home Medications    Prior to Admission medications   Medication Sig Start Date End Date Taking? Authorizing Provider   cephALEXin (KEFLEX) 500 MG capsule Take 1 capsule (500 mg total) by mouth 2 (two) times daily. 11/24/15   Lavera Guise, MD  medroxyPROGESTERone (DEPO-PROVERA) 150 MG/ML injection Inject 150 mg into the muscle every 3 (three) months.    Historical Provider, MD  phenazopyridine (PYRIDIUM) 200 MG tablet Take 1 tablet (200 mg total) by mouth 3 (three) times daily. 11/24/15   Lavera Guise, MD    Family History No family history on file.  Social History Social History  Substance Use Topics  . Smoking status: Never Smoker  . Smokeless tobacco: Never Used  . Alcohol use No     Allergies   Review of patient's allergies indicates no known allergies.   Review of Systems Review of Systems  Constitutional: Negative for fever.  HENT: Positive for ear pain (bilateral), rhinorrhea and sore throat.   Eyes: Negative for itching.  Respiratory: Negative for cough and shortness of breath.   Cardiovascular: Negative for chest pain.  Gastrointestinal: Negative for abdominal pain, nausea and vomiting.  Neurological: Positive for headaches.    Physical Exam Updated Vital Signs BP 111/74 (BP Location: Right Arm)   Pulse 115   Temp 99.5 F (37.5 C) (Oral)   Resp 16   LMP 07/03/2016   SpO2 97%   Physical Exam  Constitutional: She is oriented to person, place, and time. She appears well-developed and well-nourished. No distress.  HENT:  Head: Normocephalic and  atraumatic.  Eyes: Conjunctivae and EOM are normal.  Neck: Neck supple. No tracheal deviation present.  Cardiovascular: Normal rate.   Pulmonary/Chest: Effort normal. No respiratory distress.  Musculoskeletal: Normal range of motion.  Neurological: She is alert and oriented to person, place, and time.  Skin: Skin is warm and dry.  Psychiatric: She has a normal mood and affect. Her behavior is normal.  Nursing note and vitals reviewed.    ED Treatments / Results  Labs (all labs ordered are listed, but only abnormal results are  displayed) Labs Reviewed  RAPID STREP SCREEN (NOT [facility])  CULTURE, GROUP A STREP [facility])    EKG  EKG Interpretation None       Radiology No results found.  Procedures Procedures (including critical care time)  DIAGNOSTIC STUDIES: Oxygen Saturation is 97% on RA, normal by my interpretation.    COORDINATION OF CARE: 12:28 AM Discussed treatment plan with pt at bedside and pt agreed to plan.   Medications Ordered in ED Medications - No data to display   Initial Impression / Assessment and Plan / ED Course  I have reviewed the triage vital signs and the nursing notes.  Pertinent labs & imaging results that were available during my care of the patient were reviewed by me and considered in my medical decision making (see chart for details).  Clinical Course    Patient presents with URI symptoms and sore throat for one day. No nausea, vomiting. Negative strep. She is very well appearing. Likely viral process requiring supportive care. . I personally performed the services described in this documentation, which was scribed in my presence. The recorded information has been reviewed and is accurate.    Final Clinical Impressions(s) / ED Diagnoses   Final diagnoses:  None   1. URI 2. Pharyngitis  New Prescriptions New Prescriptions   No medications on file     Cindy AnisShari Kaimana Lurz, PA-C 07/05/16 0043    Cindy BaleElliott Wentz, MD 07/05/16 [phone]

## 2016-07-06 LAB — CULTURE, GROUP A STREP (THRC)

## 2016-07-10 ENCOUNTER — Other Ambulatory Visit: Payer: Medicaid Other

## 2016-09-04 ENCOUNTER — Ambulatory Visit (INDEPENDENT_AMBULATORY_CARE_PROVIDER_SITE_OTHER): Payer: Medicaid Other | Admitting: *Deleted

## 2016-09-04 ENCOUNTER — Encounter: Payer: Self-pay | Admitting: *Deleted

## 2016-09-04 ENCOUNTER — Encounter: Payer: Self-pay | Admitting: Family Medicine

## 2016-09-04 DIAGNOSIS — Z3201 Encounter for pregnancy test, result positive: Secondary | ICD-10-CM

## 2016-09-04 DIAGNOSIS — Z3A01 Less than 8 weeks gestation of pregnancy: Secondary | ICD-10-CM

## 2016-09-04 LAB — POCT PREGNANCY, URINE: Preg Test, Ur: POSITIVE — AB

## 2016-09-07 ENCOUNTER — Encounter (HOSPITAL_COMMUNITY): Payer: Self-pay | Admitting: Emergency Medicine

## 2016-09-07 ENCOUNTER — Emergency Department (HOSPITAL_COMMUNITY)
Admission: EM | Admit: 2016-09-07 | Discharge: 2016-09-08 | Disposition: A | Discharge status: Home / Self Care | Payer: Medicaid Other | Attending: Emergency Medicine | Admitting: Emergency Medicine

## 2016-09-07 ENCOUNTER — Emergency Department (HOSPITAL_COMMUNITY): Payer: Medicaid Other

## 2016-09-07 DIAGNOSIS — R102 Pelvic and perineal pain: Secondary | ICD-10-CM | POA: Insufficient documentation

## 2016-09-07 DIAGNOSIS — R109 Unspecified abdominal pain: Secondary | ICD-10-CM

## 2016-09-07 DIAGNOSIS — O26891 Other specified pregnancy related conditions, first trimester: Secondary | ICD-10-CM | POA: Diagnosis present

## 2016-09-07 DIAGNOSIS — Z3490 Encounter for supervision of normal pregnancy, unspecified, unspecified trimester: Secondary | ICD-10-CM

## 2016-09-07 DIAGNOSIS — Z79899 Other long term (current) drug therapy: Secondary | ICD-10-CM | POA: Insufficient documentation

## 2016-09-07 DIAGNOSIS — Z3A01 Less than 8 weeks gestation of pregnancy: Secondary | ICD-10-CM | POA: Diagnosis not present

## 2016-09-07 LAB — URINALYSIS, ROUTINE W REFLEX MICROSCOPIC
Bilirubin Urine: NEGATIVE
GLUCOSE, UA: NEGATIVE mg/dL
HGB URINE DIPSTICK: NEGATIVE
Ketones, ur: NEGATIVE mg/dL
Nitrite: NEGATIVE
PH: 7.5 (ref 5.0–8.0)
PROTEIN: NEGATIVE mg/dL
Specific Gravity, Urine: 1.014 (ref 1.005–1.030)

## 2016-09-07 LAB — I-STAT CHEM 8, ED
BUN: 11 mg/dL (ref 6–20)
CALCIUM ION: 1.22 mmol/L (ref 1.15–1.40)
CHLORIDE: 102 mmol/L (ref 101–111)
CREATININE: 0.5 mg/dL (ref 0.44–1.00)
GLUCOSE: 103 mg/dL — AB (ref 65–99)
HCT: 35 % — ABNORMAL LOW (ref 36.0–46.0)
Hemoglobin: 11.9 g/dL — ABNORMAL LOW (ref 12.0–15.0)
POTASSIUM: 3.5 mmol/L (ref 3.5–5.1)
Sodium: 139 mmol/L (ref 135–145)
TCO2: 24 mmol/L (ref 0–100)

## 2016-09-07 LAB — RH IG WORKUP (INCLUDES ABO/RH)
ABO/RH(D): A NEG
ANTIBODY SCREEN: NEGATIVE
GESTATIONAL AGE(WKS): 4

## 2016-09-07 LAB — ABO/RH
ABO/RH(D): A NEG
ANTIBODY SCREEN: NEGATIVE

## 2016-09-07 LAB — URINE MICROSCOPIC-ADD ON
RBC / HPF: NONE SEEN RBC/hpf (ref 0–5)
SQUAMOUS EPITHELIAL / LPF: NONE SEEN

## 2016-09-07 LAB — HCG, QUANTITATIVE, PREGNANCY: hCG, Beta Chain, Quant, S: 15516 m[IU]/mL — ABNORMAL HIGH (ref <5)

## 2016-09-07 NOTE — ED Notes (Signed)
Pt not in room.

## 2016-09-07 NOTE — ED Triage Notes (Signed)
Pt [redacted] weeks pregnant, c/o abdominal cramping x2 days with one episode of scant brown discharge after voiding. Denies N/V/D.

## 2016-09-07 NOTE — ED Notes (Signed)
Received call from Amy, Lab, stating "Pt is Rh negative.  Do you want Rhogam."  Informed Provider of call from lab.

## 2016-09-07 NOTE — ED Provider Notes (Signed)
[facility] Provider Note   CSN: [id number] Arrival date & time: 09/07/16  2111   By signing my name below, I, Cindy Ware, attest that this documentation has been prepared under the direction and in the presence of  Bao Bazen PA-C. Electronically Signed: Valentino Ware, ED Scribe. 09/07/16. 10:39 PM.  History   Chief Complaint Chief Complaint  Patient presents with  . Abdominal Cramping   The history is provided by the patient. No language interpreter was used.   HPI Comments: Cindy Ware is a 20 y.o. female who presents to the Emergency Department complaining of mild, gradual onset, 3/10, constant, "period-like" pelvic cramping onset 2 days ago.  Pt reports she had a positive pregnancy test at the [facility] clinic 2 days ago through a urine sample. She notes this will be her second pregnancy. She reports her first pregnancy was delivered vaginally. Per Pt, she noticed a brown blood discharge after wiping herself when using the restroom. LMP was on 08/04/2016. She has mild nausea. She denies dysuria, hematuria, fever, vomiting. She also denies any STD's. No additional complaints at this time.    Past Medical History:  Diagnosis Date  . Medical history non-contributory     Patient Active Problem List   Diagnosis Date Noted  . Oligohydramnios in third trimester, antepartum 03/12/2014  . High risk teen pregnancy in third trimester 03/01/2014  . Supervision of normal first pregnancy 08/17/2013    Past Surgical History:  Procedure Laterality Date  . NO PAST SURGERIES      OB History    Gravida Para Term Preterm AB Living   2 1 1     1    SAB TAB Ectopic Multiple Live Births           1       Home Medications    Prior to Admission medications   Medication Sig Start Date End Date Taking? Authorizing Provider  cephALEXin (KEFLEX) 500 MG capsule Take 1 capsule (500 mg total) by mouth 2 (two) times daily. Patient not taking: Reported on 09/04/2016  11/24/15   Lavera Guise, MD  phenazopyridine (PYRIDIUM) 200 MG tablet Take 1 tablet (200 mg total) by mouth 3 (three) times daily. Patient not taking: Reported on 09/04/2016 11/24/15   Lavera Guise, MD    Family History History reviewed. No pertinent family history.  Social History Social History  Substance Use Topics  . Smoking status: Never Smoker  . Smokeless tobacco: Never Used  . Alcohol use No     Allergies   Review of patient's allergies indicates no known allergies.   Review of Systems Review of Systems  All other systems reviewed and are negative.   10 Systems reviewed and all are negative for acute change except as noted in the HPI.  Physical Exam  Updated Vital Signs BP 110/77 (BP Location: Left Arm)   Pulse 73   Temp 99.1 F (37.3 C) (Oral)   Resp 17   Ht 5\' 3"  (1.6 m)   Wt 50.3 kg   LMP 08/04/2016 (Exact Date)   SpO2 99%   BMI 19.66 kg/m    Physical Exam  Constitutional: She is oriented to person, place, and time. She appears well-developed and well-nourished. She is cooperative.  Non-toxic appearance. She does not have a sickly appearance. She does not appear ill. No distress.  Well appearing young female, NAD  HENT:  Head: Normocephalic and atraumatic.  Right Ear: External ear normal.  Left Ear: External ear normal.  Nose: Nose normal.  Mouth/Throat: Oropharynx is clear and moist. No oropharyngeal exudate.  Eyes: Conjunctivae and EOM are normal. Pupils are equal, round, and reactive to light. Right eye exhibits no discharge. Left eye exhibits no discharge. No scleral icterus.  Neck: Normal range of motion. Neck supple. No JVD present. No tracheal deviation present.  Cardiovascular: Normal rate, regular rhythm, normal heart sounds and intact distal pulses.  Exam reveals no gallop and no friction rub.   No murmur heard. Pulmonary/Chest: Effort normal and breath sounds normal. No stridor. No respiratory distress. She has no wheezes. She has no  rales. She exhibits no tenderness.  Abdominal: Soft. Bowel sounds are normal. She exhibits no distension and no mass. There is no tenderness. There is no rebound and no guarding.  No CVA tenderness  Musculoskeletal: Normal range of motion. She exhibits no edema.  Lymphadenopathy:    She has no cervical adenopathy.  Neurological: She is alert and oriented to person, place, and time. She exhibits normal muscle tone. Coordination normal.  Skin: Skin is warm and dry. Capillary refill takes less than 2 seconds. No rash noted. She is not diaphoretic. No erythema. No pallor.  Psychiatric: She has a normal mood and affect. Her behavior is normal. Judgment and thought content normal.  Nursing note and vitals reviewed.    ED Treatments / Results   DIAGNOSTIC STUDIES: Oxygen Saturation is 100% on RA, normal by my interpretation.    COORDINATION OF CARE: 9:55 PM Discussed treatment plan with pt at bedside which includes labs and Obstetric US and pt agreed to plan.  Labs (all labs ordered are listed, but only abnormal results are displayed) Labs Reviewed  HCG, QUANTITATIVE, PREGNANCY - Abnormal; Notable for the following:       Result Value   hCG, Beta Chain, Quant, S 15,516 (*)    All other components within normal limits  URINALYSIS, ROUTINE W REFLEX MICROSCOPIC (NOT AT [facility]) - Abnormal; Notable for the following:    APPearance CLOUDY (*)    Leukocytes, UA LARGE (*)    All other components within normal limits  URINE MICROSCOPIC-ADD ON - Abnormal; Notable for the following:    Bacteria, UA RARE (*)    All other components within normal limits  I-STAT CHEM 8, ED - Abnormal; Notable for the following:    Glucose, Bld 103 (*)    Hemoglobin 11.9 (*)    HCT 35.0 (*)    All other components within normal limits  URINE CULTURE  RPR  HIV ANTIBODY (ROUTINE TESTING)  ABO/RH  RH IG WORKUP (INCLUDES ABO/RH)    EKG  EKG Interpretation None       Radiology US Ob Comp Less 14  Wks  Result Date: 09/08/2016 CLINICAL DATA:  20 y/o F; abdominal cramping for 2 days and brown discharge. EXAM: OBSTETRIC <14 WK [place] AND TRANSVAGINAL OB US TECHNIQUE: Both transabdominal and transvaginal ultrasound examinations were performed for complete evaluation of the gestation as well as the maternal uterus, adnexal regions, and pelvic cul-de-sac. Transvaginal technique was performed to assess early pregnancy. COMPARISON:  03/13/2014 abdominal sonogram. FINDINGS: Intrauterine gestational sac: Single Yolk sac:  Present Embryo:  Present Cardiac Activity: Present Heart Rate: 101  bpm CRL:  0.29 cm  mm   5 w   6 d                  [place] EDC: 05/04/2017 Subchorionic hemorrhage: Small hypoechoic subchorionic collection likely represents hemorrhage. Maternal uterus/adnexae: Right ovary corpus luteum,  otherwise unremarkable. IMPRESSION: Single live intrauterine pregnancy with estimated gestational age of [redacted] weeks 6 days by crown-rump length. Small subchorionic hemorrhage. Electronically Signed   By: Mitzi HansenLance  Furusawa-Stratton M.D.   On: 09/08/2016 01:15   [place] Ob Transvaginal  Result Date: 09/08/2016 CLINICAL DATA:  20 y/o F; abdominal cramping for 2 days and brown discharge. EXAM: OBSTETRIC <14 WK US AND TRANSVAGINAL OB US TECHNIQUE: Both transabdominal and transvaginal ultrasound examinations were performed for complete evaluation of the gestation as well as the maternal uterus, adnexal regions, and pelvic cul-de-sac. Transvaginal technique was performed to assess early pregnancy. COMPARISON:  03/13/2014 abdominal sonogram. FINDINGS: Intrauterine gestational sac: Single Yolk sac:  Present Embryo:  Present Cardiac Activity: Present Heart Rate: 101  bpm CRL:  0.29 cm  mm   5 w   6 d                  US EDC: 05/04/2017 Subchorionic hemorrhage: Small hypoechoic subchorionic collection likely represents hemorrhage. Maternal uterus/adnexae: Right ovary corpus luteum, otherwise unremarkable. IMPRESSION: Single live  intrauterine pregnancy with estimated gestational age of [redacted] weeks 6 days by crown-rump length. Small subchorionic hemorrhage. Electronically Signed   By: Mitzi HansenLance  Furusawa-Stratton M.D.   On: 09/08/2016 01:15    Procedures Procedures (including critical care time)  Medications Ordered in ED Medications - No data to display   Initial Impression / Assessment and Plan / ED Course  I have reviewed the triage vital signs and the nursing notes.  Pertinent labs & imaging results that were available during my care of the patient were reviewed by me and considered in my medical decision making (see chart for details).  Clinical Course   Pt with +urine pregnancy test 2 days ago, [facility], presents to the ER with 2 days of suprapubic cramping, Which is mild, feels similar to period cramps. She wiped after going to the restroom earlier today and had scant brown streaks in normal-appearing vaginal discharge.  She came to the ER for evaluation.  Basic labs obtained, significant for mild anemia, HCG obtained, pelvic US shows  Small subchorionic hemorrhage, single live IUP, est. GA [redacted] weeks 6 days.    Pt to follow up with OBGYN for routine care   Final Clinical Impressions(s) / ED Diagnoses   Final diagnoses:  Intrauterine pregnancy  Pelvic cramping    New Prescriptions Discharge Medication List as of 09/08/2016  1:38 AM     I personally performed the services described in this documentation, which was scribed in my presence. The recorded information has been reviewed and is accurate.       Danelle BerryLeisa Ople Girgis, PA-C 09/27/16 1734    Jacalyn LefevreJulie Haviland, MD 09/28/16 [phone]

## 2016-09-07 NOTE — ED Notes (Signed)
Pt in US

## 2016-09-07 NOTE — ED Notes (Signed)
Pt stated "I did the pregnancy test @ the doctor on Friday.  I've been having pain in my lower stomach.  It feels crampy."

## 2016-09-07 NOTE — ED Notes (Signed)
Pelvic cart at bedside. 

## 2016-09-08 LAB — RPR: RPR Ser Ql: NONREACTIVE

## 2016-09-08 LAB — HIV ANTIBODY (ROUTINE TESTING W REFLEX): HIV Screen 4th Generation wRfx: NONREACTIVE

## 2016-09-08 NOTE — ED Notes (Signed)
Pt now stating "I will wait a little bit longer but I'm definitely going to need a note for work."

## 2016-09-08 NOTE — ED Notes (Signed)
Pt out of room stating "I'm getting ready to go.  I've been waiting a really long time."

## 2016-09-08 NOTE — Discharge Instructions (Addendum)
See your OBGYN in 2 days for follow up.  [facility]

## 2016-09-08 NOTE — ED Notes (Signed)
Pt returned from US

## 2016-09-09 LAB — URINE CULTURE

## 2016-11-24 NOTE — L&D Delivery Note (Signed)
Patient is 21 y.o. [id number] [redacted]w[redacted]d admitted for SOL. She is GBS positive and did not receive intrapartum PCN due to precipitous delivery.   Delivery Note At 2:15 AM a viable female was delivered via Vaginal, Spontaneous Delivery (Presentation: ROA).  APGAR: 9, 9; weight pending. Placenta status: spontaneous, intact.  Cord: 3 vessel  Anesthesia:  N/A Episiotomy:  N/A Lacerations:  First degree right vaginal Suture Repair: N/A Est. Blood Loss (mL):  100  Mom to postpartum.  Baby to Couplet care / Skin to Skin.   Upon arrival patient was complete and pushing. She pushed with good maternal effort to deliver a viable female infant in cephalic, ROA position. No nuchal cord present. Baby delivered without difficulty (anterior shoulder delivered with ease), was noted to have good tone and placed on maternal abdomen for oral suctioning, drying and stimulation. Delayed cord clamping performed. Placenta delivered spontaneously with gentle cord traction. Fundus firm with massage and Pitocin. Perineum inspected and found to have first degree right vaginal laceration, which was found to be hemostatic.  Counts of sharps, instruments, and lap pads were all correct.  Jearld Lesch Sanav Remer DO PGY-2 Family Medicine Resident 08/09/2017, 2:32 AM

## 2016-12-15 ENCOUNTER — Ambulatory Visit: Payer: Medicaid Other

## 2016-12-25 ENCOUNTER — Ambulatory Visit: Payer: Medicaid Other

## 2016-12-29 ENCOUNTER — Encounter: Payer: Self-pay | Admitting: Family Medicine

## 2016-12-29 ENCOUNTER — Ambulatory Visit: Payer: Medicaid Other

## 2016-12-29 DIAGNOSIS — Z3201 Encounter for pregnancy test, result positive: Secondary | ICD-10-CM

## 2016-12-29 LAB — POCT PREGNANCY, URINE: Preg Test, Ur: POSITIVE — AB

## 2016-12-29 NOTE — Progress Notes (Signed)
Patient presented to office today for a pregnancy test. Test confirms she is pregnant around 7 weeks.  Medication were reviewed and patient has been given a letter of verification to apply for medicaid. Patient advised to start taking prenatal vitamins. Patient has appointment to follow up with our office in a couple of weeks.

## 2016-12-30 ENCOUNTER — Ambulatory Visit: Payer: Medicaid Other

## 2017-01-21 ENCOUNTER — Encounter: Payer: Medicaid Other | Admitting: Obstetrics and Gynecology

## 2017-01-25 ENCOUNTER — Encounter (HOSPITAL_COMMUNITY): Payer: Self-pay

## 2017-01-25 ENCOUNTER — Inpatient Hospital Stay (HOSPITAL_COMMUNITY)
Admission: AD | Admit: 2017-01-25 | Discharge: 2017-01-26 | Disposition: A | Discharge status: Home / Self Care | Payer: Medicaid Other | Source: Ambulatory Visit | Attending: Obstetrics and Gynecology | Admitting: Obstetrics and Gynecology

## 2017-01-25 DIAGNOSIS — O2341 Unspecified infection of urinary tract in pregnancy, first trimester: Secondary | ICD-10-CM | POA: Insufficient documentation

## 2017-01-25 DIAGNOSIS — Z3A11 11 weeks gestation of pregnancy: Secondary | ICD-10-CM | POA: Diagnosis not present

## 2017-01-25 DIAGNOSIS — R109 Unspecified abdominal pain: Secondary | ICD-10-CM | POA: Diagnosis present

## 2017-01-25 LAB — URINALYSIS, ROUTINE W REFLEX MICROSCOPIC
BILIRUBIN URINE: NEGATIVE
Glucose, UA: NEGATIVE mg/dL
Ketones, ur: NEGATIVE mg/dL
NITRITE: NEGATIVE
Protein, ur: NEGATIVE mg/dL
SPECIFIC GRAVITY, URINE: 1.01 (ref 1.005–1.030)
pH: 7 (ref 5.0–8.0)

## 2017-01-25 LAB — URINALYSIS, MICROSCOPIC (REFLEX)

## 2017-01-25 LAB — CBC
HCT: 31 % — ABNORMAL LOW (ref 36.0–46.0)
Hemoglobin: 10.9 g/dL — ABNORMAL LOW (ref 12.0–15.0)
MCH: 32.6 pg (ref 26.0–34.0)
MCHC: 35.2 g/dL (ref 30.0–36.0)
MCV: 92.8 fL (ref 78.0–100.0)
PLATELETS: 249 10*3/uL (ref 150–400)
RBC: 3.34 MIL/uL — AB (ref 3.87–5.11)
RDW: 12.6 % (ref 11.5–15.5)
WBC: 8.4 10*3/uL (ref 4.0–10.5)

## 2017-01-25 MED ORDER — CEPHALEXIN 500 MG PO CAPS
500.0000 mg | ORAL_CAPSULE | Freq: Four times a day (QID) | ORAL | 0 refills | Status: DC
Start: 2017-01-25 — End: 2017-01-29

## 2017-01-25 NOTE — MAU Note (Signed)
Pt presents to MAU with left sided flank pain. Pt reports she has a hx of bladder infections and feels like this is the cause of her pain. Denies any bleeding or leaking of fluid.

## 2017-01-25 NOTE — Discharge Instructions (Signed)

## 2017-01-25 NOTE — MAU Provider Note (Signed)
Chief Complaint: Abdominal Pain   First Provider Initiated Contact with Patient 01/25/17 2332      SUBJECTIVE HPI: Cindy Ware is a 21 y.o. G3P1011 at 8549w4d by LMP who presents to maternity admissions reporting pain in left flank x 2 weeks. She reports hx of UTI with similar symptoms.  She reports before onset of pain her urine had an unusual smell, and this is how her UTIs usually start.  This is a new symptom, it is constant and worse with movement, improved with rest.  It has no associated symptoms.  [facility] but was told there is a wait. She is [facility] in other [facility] offices.  She denies vaginal bleeding, vaginal itching/burning, urinary symptoms, h/a, dizziness, n/v, or fever/chills.     HPI  Past Medical History:  Diagnosis Date  . Medical history non-contributory    Past Surgical History:  Procedure Laterality Date  . NO PAST SURGERIES    . THERAPEUTIC ABORTION     Social History   Social History  . Marital status: Single    Spouse name: N/A  . Number of children: N/A  . Years of education: N/A   Occupational History  . Not on file.   Social History Main Topics  . Smoking status: Never Smoker  . Smokeless tobacco: Never Used  . Alcohol use No  . Drug use: Yes    Types: Marijuana     Comment: no use since pregnancy  . Sexual activity: Yes    Birth control/ protection: None   Other Topics Concern  . Not on file   Social History Narrative  . No narrative on file   No current facility-administered medications on file prior to encounter.    Current Outpatient Prescriptions on File Prior to Encounter  Medication Sig Dispense Refill  . cephALEXin (KEFLEX) 500 MG capsule Take 1 capsule (500 mg total) by mouth 2 (two) times daily. (Patient not taking: Reported on 09/04/2016) 10 capsule 0  . phenazopyridine (PYRIDIUM) 200 MG tablet Take 1 tablet (200 mg total) by mouth 3 (three) times daily. (Patient not taking:  Reported on 09/04/2016) 6 tablet 0   No Known Allergies  ROS:  Review of Systems  Constitutional: Negative for chills, fatigue and fever.  Respiratory: Negative for shortness of breath.   Cardiovascular: Negative for chest pain.  Gastrointestinal: Negative for constipation, nausea and vomiting.  Genitourinary: Positive for flank pain and pelvic pain. Negative for difficulty urinating, dysuria, vaginal bleeding, vaginal discharge and vaginal pain.  Neurological: Negative for dizziness and headaches.  Psychiatric/Behavioral: Negative.      I have reviewed patient's Past Medical Hx, Surgical Hx, Family Hx, Social Hx, medications and allergies.   Physical Exam  Patient Vitals for the past 24 hrs:  BP Temp Temp src Pulse Resp Height Weight  01/25/17 2133 109/72 98.8 F (37.1 C) Oral (!) 122 18 - -  01/25/17 2127 - - - - - 5\' 3"  (1.6 m) 117 lb (53.1 kg)   Constitutional: Well-developed, well-nourished female in no acute distress.  Cardiovascular: normal rate Respiratory: normal effort GI: Abd soft, non-tender. Pos BS x 4 MS: Extremities nontender, no edema, normal ROM Neurologic: Alert and oriented x 4.  GU: Neg CVAT.  PELVIC EXAM: Cervix pink, visually closed, without lesion, scant white creamy discharge, vaginal walls and external genitalia normal Bimanual exam: Cervix 0/long/high, firm, anterior, neg CMT, uterus nontender, nonenlarged, adnexa without tenderness, enlargement, or mass  FHT 176  by doppler  LAB RESULTS Results for orders placed or performed during the hospital encounter of 01/25/17 (from the past 24 hour(s))  Urinalysis, Routine w reflex microscopic     Status: Abnormal   Collection Time: 01/25/17  9:23 PM  Result Value Ref Range   Color, Urine YELLOW YELLOW   APPearance CLEAR CLEAR   Specific Gravity, Urine 1.010 1.005 - 1.030   pH 7.0 5.0 - 8.0   Glucose, UA NEGATIVE NEGATIVE mg/dL   Hgb urine dipstick TRACE (A) NEGATIVE   Bilirubin Urine NEGATIVE NEGATIVE    Ketones, ur NEGATIVE NEGATIVE mg/dL   Protein, ur NEGATIVE NEGATIVE mg/dL   Nitrite NEGATIVE NEGATIVE   Leukocytes, UA MODERATE (A) NEGATIVE  Urinalysis, Microscopic (reflex)     Status: Abnormal   Collection Time: 01/25/17  9:23 PM  Result Value Ref Range   RBC / HPF 0-5 0 - 5 RBC/hpf   WBC, UA 0-5 0 - 5 WBC/hpf   Bacteria, UA FEW (A) NONE SEEN   Squamous Epithelial / LPF 0-5 (A) NONE SEEN  CBC     Status: Abnormal   Collection Time: 01/25/17 10:27 PM  Result Value Ref Range   WBC 8.4 4.0 - 10.5 K/uL   RBC 3.34 (L) 3.87 - 5.11 MIL/uL   Hemoglobin 10.9 (L) 12.0 - 15.0 g/dL   HCT 16.1 (L) 09.6 - 04.5 %   MCV 92.8 78.0 - 100.0 fL   MCH 32.6 26.0 - 34.0 pg   MCHC 35.2 30.0 - 36.0 g/dL   RDW 40.9 81.1 - 91.4 %   Platelets 249 150 - 400 K/uL    --/--/A NEG, A NEG (10/15 2147)  IMAGING No results found.  MAU Management/MDM: Ordered labs and reviewed results.  With pt hx and U/S showing moderate leukocytes, will treat for UTI with Keflex 500 mg QID x 7 days.  No evidence of pyelonephritis.  Pt to f/u with [facility] for prenatal care, message sent to office.  Pt stable at time of discharge.  ASSESSMENT 1. UTI (urinary tract infection) during pregnancy, first trimester     PLAN Discharge home Allergies as of 01/25/2017   No Known Allergies     Medication List    STOP taking these medications   phenazopyridine 200 MG tablet Commonly known as:  PYRIDIUM     TAKE these medications   cephALEXin 500 MG capsule Commonly known as:  KEFLEX Take 1 capsule (500 mg total) by mouth 4 (four) times daily. What changed:  when to take this      Follow-up Information    [facility] Follow up.   Specialty:  Obstetrics and Gynecology Why:  The office will call you to make an appointment.  Return to MAU as needed for emergencies. Contact information: [address] [place] [address] [phone]           Sharen Counter Certified Nurse-Midwife 01/25/2017  11:59 PM

## 2017-01-27 LAB — URINE CULTURE

## 2017-01-29 ENCOUNTER — Other Ambulatory Visit (HOSPITAL_COMMUNITY)
Admission: RE | Admit: 2017-01-29 | Discharge: 2017-01-29 | Disposition: A | Discharge status: Home / Self Care | Payer: Medicaid Other | Source: Ambulatory Visit | Attending: Obstetrics and Gynecology | Admitting: Obstetrics and Gynecology

## 2017-01-29 ENCOUNTER — Encounter: Payer: Self-pay | Admitting: Obstetrics and Gynecology

## 2017-01-29 ENCOUNTER — Ambulatory Visit (INDEPENDENT_AMBULATORY_CARE_PROVIDER_SITE_OTHER): Payer: Medicaid Other | Admitting: Obstetrics and Gynecology

## 2017-01-29 DIAGNOSIS — Z01419 Encounter for gynecological examination (general) (routine) without abnormal findings: Secondary | ICD-10-CM | POA: Insufficient documentation

## 2017-01-29 DIAGNOSIS — Z124 Encounter for screening for malignant neoplasm of cervix: Secondary | ICD-10-CM

## 2017-01-29 DIAGNOSIS — Z3483 Encounter for supervision of other normal pregnancy, third trimester: Secondary | ICD-10-CM

## 2017-01-29 DIAGNOSIS — Z23 Encounter for immunization: Secondary | ICD-10-CM

## 2017-01-29 DIAGNOSIS — Z113 Encounter for screening for infections with a predominantly sexual mode of transmission: Secondary | ICD-10-CM | POA: Insufficient documentation

## 2017-01-29 DIAGNOSIS — Z3481 Encounter for supervision of other normal pregnancy, first trimester: Secondary | ICD-10-CM

## 2017-01-29 DIAGNOSIS — Z3491 Encounter for supervision of normal pregnancy, unspecified, first trimester: Secondary | ICD-10-CM

## 2017-01-29 DIAGNOSIS — Z3490 Encounter for supervision of normal pregnancy, unspecified, unspecified trimester: Secondary | ICD-10-CM | POA: Insufficient documentation

## 2017-01-29 NOTE — Patient Instructions (Signed)
First Trimester of Pregnancy The first trimester of pregnancy is from week 1 until the end of week 13 (months 1 through 3). During this time, your baby will begin to develop inside you. At 6-8 weeks, the eyes and face are formed, and the heartbeat can be seen on ultrasound. At the end of 12 weeks, all the baby's organs are formed. Prenatal care is all the medical care you receive before the birth of your baby. Make sure you get good prenatal care and follow all of your doctor's instructions. Follow these instructions at home: Medicines  Take over-the-counter and prescription medicines only as told by your doctor. Some medicines are safe and some medicines are not safe during pregnancy.  Take a prenatal vitamin that contains at least 600 micrograms (mcg) of folic acid.  If you have trouble pooping (constipation), take medicine that will make your stool soft (stool softener) if your doctor approves. Eating and drinking  Eat regular, healthy meals.  Your doctor will tell you the amount of weight gain that is right for you.  Avoid raw meat and uncooked cheese.  If you feel sick to your stomach (nauseous) or throw up (vomit): ? Eat 4 or 5 small meals a day instead of 3 large meals. ? Try eating a few soda crackers. ? Drink liquids between meals instead of during meals.  To prevent constipation: ? Eat foods that are high in fiber, like fresh fruits and vegetables, whole grains, and beans. ? Drink enough fluids to keep your pee (urine) clear or pale yellow. Activity  Exercise only as told by your doctor. Stop exercising if you have cramps or pain in your lower belly (abdomen) or low back.  Do not exercise if it is too hot, too humid, or if you are in a place of great height (high altitude).  Try to avoid standing for long periods of time. Move your legs often if you must stand in one place for a long time.  Avoid heavy lifting.  Wear low-heeled shoes. Sit and stand up straight.  You  can have sex unless your doctor tells you not to. Relieving pain and discomfort  Wear a good support bra if your breasts are sore.  Take warm water baths (sitz baths) to soothe pain or discomfort caused by hemorrhoids. Use hemorrhoid cream if your doctor says it is okay.  Rest with your legs raised if you have leg cramps or low back pain.  If you have puffy, bulging veins (varicose veins) in your legs: ? Wear support hose or compression stockings as told by your doctor. ? Raise (elevate) your feet for 15 minutes, 3-4 times a day. ? Limit salt in your food. Prenatal care  Schedule your prenatal visits by the twelfth week of pregnancy.  Write down your questions. Take them to your prenatal visits.  Keep all your prenatal visits as told by your doctor. This is important. Safety  Wear your seat belt at all times when driving.  Make a list of emergency phone numbers. The list should include numbers for family, friends, the hospital, and police and fire departments. General instructions  Ask your doctor for a referral to a local prenatal class. Begin classes no later than at the start of month 6 of your pregnancy.  Ask for help if you need counseling or if you need help with nutrition. Your doctor can give you advice or tell you where to go for help.  Do not use hot tubs, steam rooms, or   saunas.  Do not douche or use tampons or scented sanitary pads.  Do not cross your legs for long periods of time.  Avoid all herbs and alcohol. Avoid drugs that are not approved by your doctor.  Do not use any tobacco products, including cigarettes, chewing tobacco, and electronic cigarettes. If you need help quitting, ask your doctor. You may get counseling or other support to help you quit.  Avoid cat litter boxes and soil used by cats. These carry germs that can cause birth defects in the baby and can cause a loss of your baby (miscarriage) or stillbirth.  Visit your dentist. At home, brush  your teeth with a soft toothbrush. Be gentle when you floss. Contact a doctor if:  You are dizzy.  You have mild cramps or pressure in your lower belly.  You have a nagging pain in your belly area.  You continue to feel sick to your stomach, you throw up, or you have watery poop (diarrhea).  You have a bad smelling fluid coming from your vagina.  You have pain when you pee (urinate).  You have increased puffiness (swelling) in your face, hands, legs, or ankles. Get help right away if:  You have a fever.  You are leaking fluid from your vagina.  You have spotting or bleeding from your vagina.  You have very bad belly cramping or pain.  You gain or lose weight rapidly.  You throw up blood. It may look like coffee grounds.  You are around people who have German measles, fifth disease, or chickenpox.  You have a very bad headache.  You have shortness of breath.  You have any kind of trauma, such as from a fall or a car accident. Summary  The first trimester of pregnancy is from week 1 until the end of week 13 (months 1 through 3).  To take care of yourself and your unborn baby, you will need to eat healthy meals, take medicines only if your doctor tells you to do so, and do activities that are safe for you and your baby.  Keep all follow-up visits as told by your doctor. This is important as your doctor will have to ensure that your baby is healthy and growing well. This information is not intended to replace advice given to you by your health care provider. Make sure you discuss any questions you have with your health care provider. Document Released: 04/28/2008 Document Revised: 11/18/2016 Document Reviewed: 11/18/2016 Elsevier Interactive Patient Education  2017 [employer]  

## 2017-01-29 NOTE — Progress Notes (Signed)
   PRENATAL VISIT NOTE  Subjective:  Cindy Ware is a 21 y.o. G3P1011 at 6954w1d being seen today for inital prenatal care.  She is currently monitored for the following issues for this low-risk pregnancy and has Supervision of low-risk pregnancy on her problem list.  Patient reports no complaints.  Contractions: Not present. Vag. Bleeding: None.  Movement: Absent. Denies leaking of fluid.   The following portions of the patient's history were reviewed and updated as appropriate: allergies, current medications, past family history, past medical history, past social history, past surgical history and problem list. Problem list updated.  Objective:   Vitals:   01/29/17 1246  BP: 114/70  Pulse: (!) 119  Weight: 115 lb 14.4 oz (52.6 kg)    Fetal Status: Fetal Heart Rate (bpm): 166   Movement: Absent     General:  Alert, oriented and cooperative. Patient is in no acute distress.  Skin: Skin is warm and dry. No rash noted.   Cardiovascular: Normal heart rate noted  Respiratory: Normal respiratory effort, no problems with respiration noted  Abdomen: Soft, gravid, appropriate for gestational age. Pain/Pressure: Absent     Pelvic:  Cervical exam performed       c/t/H no CMT, health pink vaginal mucosa, 12wk uterus size  Extremities: Normal range of motion.  Edema: None  Mental Status: Normal mood and affect. Normal behavior. Normal judgment and thought content.   Assessment and Plan:  Pregnancy: G3P1011 at 7554w1d  1. Encounter for supervision of low-risk pregnancy in first trimester -Routine Care no risk factors, previous term vaginal delivery.  - Prenatal Profile I - Culture, OB Urine - Flu Vaccine QUAD 36+ mos IM (Fluarix, Quad PF) - Cytology - PAP - US MFM Fetal Nuchal Translucency; Future  2. Patient was seen in [facility] 3/4 for possible UTI. Culture came back mixed flora. She has not started keflex. Advised no need to start. Her symptmos have resolved and we will repeat culture  today.   Preterm labor symptoms and general obstetric precautions including but not limited to vaginal bleeding, contractions, leaking of fluid and fetal movement were reviewed in detail with the patient. Please refer to After Visit Summary for other counseling recommendations.  No Follow-up on file.   Lorne SkeensNicholas Michael Hetvi Shawhan, MD

## 2017-01-30 ENCOUNTER — Encounter (HOSPITAL_COMMUNITY): Payer: Self-pay | Admitting: Obstetrics and Gynecology

## 2017-01-30 LAB — PRENATAL PROFILE I(LABCORP)
ANTIBODY SCREEN: NEGATIVE
BASOS: 0 %
Basophils Absolute: 0 10*3/uL (ref 0.0–0.2)
EOS (ABSOLUTE): 0.2 10*3/uL (ref 0.0–0.4)
Eos: 2 %
HEMATOCRIT: 34.2 % (ref 34.0–46.6)
Hemoglobin: 11.9 g/dL (ref 11.1–15.9)
Hepatitis B Surface Ag: NEGATIVE
Immature Grans (Abs): 0 10*3/uL (ref 0.0–0.1)
Immature Granulocytes: 0 %
LYMPHS ABS: 2.1 10*3/uL (ref 0.7–3.1)
Lymphs: 20 %
MCH: 31.7 pg (ref 26.6–33.0)
MCHC: 34.8 g/dL (ref 31.5–35.7)
MCV: 91 fL (ref 79–97)
MONOCYTES: 7 %
Monocytes Absolute: 0.7 10*3/uL (ref 0.1–0.9)
NEUTROS ABS: 7.1 10*3/uL — AB (ref 1.4–7.0)
Neutrophils: 71 %
Platelets: 334 10*3/uL (ref 150–379)
RBC: 3.75 x10E6/uL — AB (ref 3.77–5.28)
RDW: 13.1 % (ref 12.3–15.4)
RPR Ser Ql: NONREACTIVE
Rh Factor: NEGATIVE
Rubella Antibodies, IGG: 1.97 index (ref >0.99)
WBC: 10.1 10*3/uL (ref 3.4–10.8)

## 2017-02-02 ENCOUNTER — Telehealth: Payer: Self-pay | Admitting: Obstetrics and Gynecology

## 2017-02-02 ENCOUNTER — Encounter: Payer: Self-pay | Admitting: Obstetrics and Gynecology

## 2017-02-02 DIAGNOSIS — R8271 Bacteriuria: Secondary | ICD-10-CM

## 2017-02-02 DIAGNOSIS — O9989 Other specified diseases and conditions complicating pregnancy, childbirth and the puerperium: Principal | ICD-10-CM

## 2017-02-02 LAB — URINE CULTURE, OB REFLEX

## 2017-02-02 LAB — CULTURE, OB URINE

## 2017-02-02 LAB — CYTOLOGY - PAP
Chlamydia: NEGATIVE
Diagnosis: NEGATIVE
Neisseria Gonorrhea: NEGATIVE

## 2017-02-02 MED ORDER — METRONIDAZOLE 500 MG PO TABS: 2000.0000 mg | ORAL_TABLET | Freq: Once | ORAL | 0 refills | Status: AC

## 2017-02-02 MED ORDER — NITROFURANTOIN MONOHYD MACRO 100 MG PO CAPS: 100.0000 mg | ORAL_CAPSULE | Freq: Two times a day (BID) | ORAL | 0 refills | Status: DC

## 2017-02-02 NOTE — Telephone Encounter (Signed)
Patient informed of positive trich status. Called 2g flagyl to patients pharmacy.

## 2017-02-02 NOTE — Telephone Encounter (Signed)
Patient with positive e-coli in urine on pre-natal labs. Treated with macrobid 500mg  BID. D/W patient. Prescription called to pharmacy.

## 2017-02-06 ENCOUNTER — Ambulatory Visit (HOSPITAL_COMMUNITY)
Admission: RE | Admit: 2017-02-06 | Discharge: 2017-02-06 | Disposition: A | Discharge status: Home / Self Care | Payer: Medicaid Other | Source: Ambulatory Visit | Attending: Obstetrics and Gynecology | Admitting: Obstetrics and Gynecology

## 2017-02-06 ENCOUNTER — Other Ambulatory Visit: Payer: Self-pay

## 2017-02-06 ENCOUNTER — Other Ambulatory Visit: Payer: Self-pay | Admitting: Obstetrics and Gynecology

## 2017-02-06 ENCOUNTER — Encounter (HOSPITAL_COMMUNITY): Payer: Self-pay

## 2017-02-06 ENCOUNTER — Other Ambulatory Visit (HOSPITAL_COMMUNITY): Payer: Medicaid Other

## 2017-02-06 DIAGNOSIS — Z3A13 13 weeks gestation of pregnancy: Secondary | ICD-10-CM | POA: Insufficient documentation

## 2017-02-06 DIAGNOSIS — Z3491 Encounter for supervision of normal pregnancy, unspecified, first trimester: Secondary | ICD-10-CM | POA: Insufficient documentation

## 2017-02-06 DIAGNOSIS — Z3682 Encounter for antenatal screening for nuchal translucency: Secondary | ICD-10-CM

## 2017-02-10 ENCOUNTER — Telehealth: Payer: Self-pay

## 2017-02-10 ENCOUNTER — Other Ambulatory Visit: Payer: Self-pay

## 2017-02-10 MED ORDER — TERCONAZOLE 0.4 % VA CREA: 1.0000 | TOPICAL_CREAM | Freq: Every day | VAGINAL | 0 refills | Status: DC

## 2017-02-10 NOTE — Telephone Encounter (Signed)
Patient called requesting Rx, stating that her std has not cleared up. States that she still has Tree surgeondischarge/odor. Please call patient back or send back to me for scheduling, if needed.   Called patient informed no answer left message we are calling about some test results.

## 2017-02-11 NOTE — Telephone Encounter (Signed)
Mychart message to patient offering her to come in for self swab.

## 2017-02-19 ENCOUNTER — Encounter: Payer: Self-pay | Admitting: Obstetrics and Gynecology

## 2017-02-19 DIAGNOSIS — Z6791 Unspecified blood type, Rh negative: Secondary | ICD-10-CM | POA: Insufficient documentation

## 2017-02-19 DIAGNOSIS — O26899 Other specified pregnancy related conditions, unspecified trimester: Secondary | ICD-10-CM

## 2017-03-19 ENCOUNTER — Ambulatory Visit (HOSPITAL_COMMUNITY)
Admission: RE | Admit: 2017-03-19 | Discharge: 2017-03-19 | Disposition: A | Discharge status: Home / Self Care | Payer: Medicaid Other | Source: Ambulatory Visit | Attending: Obstetrics and Gynecology | Admitting: Obstetrics and Gynecology

## 2017-03-19 ENCOUNTER — Other Ambulatory Visit (HOSPITAL_COMMUNITY)
Admission: RE | Admit: 2017-03-19 | Discharge: 2017-03-19 | Disposition: A | Discharge status: Home / Self Care | Payer: Medicaid Other | Source: Ambulatory Visit | Attending: Medical | Admitting: Medical

## 2017-03-19 ENCOUNTER — Ambulatory Visit (INDEPENDENT_AMBULATORY_CARE_PROVIDER_SITE_OTHER): Payer: Medicaid Other | Admitting: Obstetrics and Gynecology

## 2017-03-19 ENCOUNTER — Other Ambulatory Visit: Payer: Self-pay | Admitting: Obstetrics and Gynecology

## 2017-03-19 VITALS — BP 114/64 | HR 65 | Wt 122.0 lb

## 2017-03-19 DIAGNOSIS — A5602 Chlamydial vulvovaginitis: Secondary | ICD-10-CM | POA: Insufficient documentation

## 2017-03-19 DIAGNOSIS — Z369 Encounter for antenatal screening, unspecified: Secondary | ICD-10-CM

## 2017-03-19 DIAGNOSIS — O26899 Other specified pregnancy related conditions, unspecified trimester: Secondary | ICD-10-CM

## 2017-03-19 DIAGNOSIS — Z3A19 19 weeks gestation of pregnancy: Secondary | ICD-10-CM

## 2017-03-19 DIAGNOSIS — Z3492 Encounter for supervision of normal pregnancy, unspecified, second trimester: Secondary | ICD-10-CM | POA: Diagnosis not present

## 2017-03-19 DIAGNOSIS — Z3491 Encounter for supervision of normal pregnancy, unspecified, first trimester: Secondary | ICD-10-CM

## 2017-03-19 DIAGNOSIS — O98819 Other maternal infectious and parasitic diseases complicating pregnancy, unspecified trimester: Secondary | ICD-10-CM

## 2017-03-19 DIAGNOSIS — Z6791 Unspecified blood type, Rh negative: Secondary | ICD-10-CM

## 2017-03-19 DIAGNOSIS — Z363 Encounter for antenatal screening for malformations: Secondary | ICD-10-CM | POA: Diagnosis not present

## 2017-03-19 DIAGNOSIS — O09899 Supervision of other high risk pregnancies, unspecified trimester: Secondary | ICD-10-CM

## 2017-03-19 DIAGNOSIS — O98312 Other infections with a predominantly sexual mode of transmission complicating pregnancy, second trimester: Secondary | ICD-10-CM | POA: Insufficient documentation

## 2017-03-19 DIAGNOSIS — A749 Chlamydial infection, unspecified: Secondary | ICD-10-CM

## 2017-03-19 NOTE — Progress Notes (Signed)
   PRENATAL VISIT NOTE  Subjective:  Cindy Ware is a 21 y.o. G3P1011 at [redacted]w[redacted]d being seen today for ongoing prenatal care.  She is currently monitored for the following issues for this low-risk pregnancy and has Supervision of low-risk pregnancy and Rh negative state in antepartum period on her problem list.  Patient reports no complaints.  Contractions: Not present. Vag. Bleeding: None.  Movement: Present. Denies leaking of fluid.   The following portions of the patient's history were reviewed and updated as appropriate: allergies, current medications, past family history, past medical history, past social history, past surgical history and problem list. Problem list updated.  Objective:   Vitals:   03/19/17 1430  BP: 114/64  Pulse: 65  Weight: 122 lb (55.3 kg)    Fetal Status: Fetal Heart Rate (bpm): 164   Movement: Present x 2 wks     General:  Alert, oriented and cooperative. Patient is in no acute distress.  Skin: Skin is warm and dry. No rash noted.   Cardiovascular: Normal heart rate noted  Respiratory: Normal respiratory effort, no problems with respiration noted  Abdomen: Soft, gravid, appropriate for gestational age. Pain/Pressure: Absent     Pelvic:  speculum exam for Chlamydia TOC        Extremities: Normal range of motion.  Edema: None  Mental Status: Normal mood and affect. Normal behavior. Normal judgment and thought content.   Assessment and Plan:  Pregnancy: G3P1011 at [redacted]w[redacted]d  1. Encounter for supervision of low-risk pregnancy in second trimester  - [place] MFM OB FOLLOW UP; Future  2. Rh negative state in antepartum period  - Discussed receiving Rhogam injection at 28 wk visit  3. Chlamydia infection affecting pregnancy, antepartum  - Cervicovaginal ancillary only; TOC  Preterm labor symptoms and general obstetric precautions including but not limited to vaginal bleeding, contractions, leaking of fluid and fetal movement were reviewed in detail with  the patient. Please refer to After Visit Summary for other counseling recommendations.  Return in about 2 months (around 05/19/2017) for Return OB 2hr GTT and 3rd trimester labs.   Raelyn Mora, CNM

## 2017-03-19 NOTE — Patient Instructions (Signed)
Third Trimester of Pregnancy The third trimester is from week 28 through week 40 (months 7 through 9). The third trimester is a time when the unborn baby (fetus) is growing rapidly. At the end of the ninth month, the fetus is about 20 inches in length and weighs 6-10 pounds. Body changes during your third trimester Your body will continue to go through many changes during pregnancy. The changes vary from woman to woman. During the third trimester:  Your weight will continue to increase. You can expect to gain 25-35 pounds (11-16 kg) by the end of the pregnancy.  You may begin to get stretch marks on your hips, abdomen, and breasts.  You may urinate more often because the fetus is moving lower into your pelvis and pressing on your bladder.  You may develop or continue to have heartburn. This is caused by increased hormones that slow down muscles in the digestive tract.  You may develop or continue to have constipation because increased hormones slow digestion and cause the muscles that push waste through your intestines to relax.  You may develop hemorrhoids. These are swollen veins (varicose veins) in the rectum that can itch or be painful.  You may develop swollen, bulging veins (varicose veins) in your legs.  You may have increased body aches in the pelvis, back, or thighs. This is due to weight gain and increased hormones that are relaxing your joints.  You may have changes in your hair. These can include thickening of your hair, rapid growth, and changes in texture. Some women also have hair loss during or after pregnancy, or hair that feels dry or thin. Your hair will most likely return to normal after your baby is born.  Your breasts will continue to grow and they will continue to become tender. A yellow fluid (colostrum) may leak from your breasts. This is the first milk you are producing for your baby.  Your belly button may stick out.  You may notice more swelling in your hands,  face, or ankles.  You may have increased tingling or numbness in your hands, arms, and legs. The skin on your belly may also feel numb.  You may feel short of breath because of your expanding uterus.  You may have more problems sleeping. This can be caused by the size of your belly, increased need to urinate, and an increase in your body's metabolism.  You may notice the fetus "dropping," or moving lower in your abdomen (lightening).  You may have increased vaginal discharge.  You may notice your joints feel loose and you may have pain around your pelvic bone. What to expect at prenatal visits You will have prenatal exams every 2 weeks until week 36. Then you will have weekly prenatal exams. During a routine prenatal visit:  You will be weighed to make sure you and the baby are growing normally.  Your blood pressure will be taken.  Your abdomen will be measured to track your baby's growth.  The fetal heartbeat will be listened to.  Any test results from the previous visit will be discussed.  You may have a cervical check near your due date to see if your cervix has softened or thinned (effaced).  You will be tested for Group B streptococcus. This happens between 35 and 37 weeks. Your health care provider may ask you:  What your birth plan is.  How you are feeling.  If you are feeling the baby move.  If you have had any abnormal   symptoms, such as leaking fluid, bleeding, severe headaches, or abdominal cramping.  If you are using any tobacco products, including cigarettes, chewing tobacco, and electronic cigarettes.  If you have any questions. Other tests or screenings that may be performed during your third trimester include:  Blood tests that check for low iron levels (anemia).  Fetal testing to check the health, activity level, and growth of the fetus. Testing is done if you have certain medical conditions or if there are problems during the pregnancy.  Nonstress test  (NST). This test checks the health of your baby to make sure there are no signs of problems, such as the baby not getting enough oxygen. During this test, a belt is placed around your belly. The baby is made to move, and its heart rate is monitored during movement. What is false labor? False labor is a condition in which you feel small, irregular tightenings of the muscles in the womb (contractions) that usually go away with rest, changing position, or drinking water. These are called Braxton Hicks contractions. Contractions may last for hours, days, or even weeks before true labor sets in. If contractions come at regular intervals, become more frequent, increase in intensity, or become painful, you should see your health care provider. What are the signs of labor?  Abdominal cramps.  Regular contractions that start at 10 minutes apart and become stronger and more frequent with time.  Contractions that start on the top of the uterus and spread down to the lower abdomen and back.  Increased pelvic pressure and dull back pain.  A watery or bloody mucus discharge that comes from the vagina.  Leaking of amniotic fluid. This is also known as your "water breaking." It could be a slow trickle or a gush. Let your health care provider know if it has a color or strange odor. If you have any of these signs, call your health care provider right away, even if it is before your due date. Follow these instructions at home: Medicines   Follow your health care provider's instructions regarding medicine use. Specific medicines may be either safe or unsafe to take during pregnancy.  Take a prenatal vitamin that contains at least 600 micrograms (mcg) of folic acid.  If you develop constipation, try taking a stool softener if your health care provider approves. Eating and drinking   Eat a balanced diet that includes fresh fruits and vegetables, whole grains, good sources of protein such as meat, eggs, or tofu,  and low-fat dairy. Your health care provider will help you determine the amount of weight gain that is right for you.  Avoid raw meat and uncooked cheese. These carry germs that can cause birth defects in the baby.  If you have low calcium intake from food, talk to your health care provider about whether you should take a daily calcium supplement.  Eat four or five small meals rather than three large meals a day.  Limit foods that are high in fat and processed sugars, such as fried and sweet foods.  To prevent constipation:  Drink enough fluid to keep your urine clear or pale yellow.  Eat foods that are high in fiber, such as fresh fruits and vegetables, whole grains, and beans. Activity   Exercise only as directed by your health care provider. Most women can continue their usual exercise routine during pregnancy. Try to exercise for 30 minutes at least 5 days a week. Stop exercising if you experience uterine contractions.  Avoid heavy lifting.  lifting.  Do not exercise in extreme heat or humidity, or at high altitudes.  Wear low-heel, comfortable shoes.  Practice good posture.  You may continue to have sex unless your health care provider tells you otherwise. Relieving pain and discomfort   Take frequent breaks and rest with your legs elevated if you have leg cramps or low back pain.  Take warm sitz baths to soothe any pain or discomfort caused by hemorrhoids. Use hemorrhoid cream if your health care provider approves.  Wear a good support bra to prevent discomfort from breast tenderness.  If you develop varicose veins:  Wear support pantyhose or compression stockings as told by your healthcare provider.  Elevate your feet for 15 minutes, 3-4 times a day. Prenatal care   Write down your questions. Take them to your prenatal visits.  Keep all your prenatal visits as told by your health care provider. This is important. Safety   Wear your seat belt  at all times when driving.  Make a list of emergency phone numbers, including numbers for family, friends, the hospital, and police and fire departments. General instructions   Avoid cat litter boxes and soil used by cats. These carry germs that can cause birth defects in the baby. If you have a cat, ask someone to clean the litter box for you.  Do not travel far distances unless it is absolutely necessary and only with the approval of your health care provider.  Do not use hot tubs, steam rooms, or saunas.  Do not drink alcohol.  Do not use any products that contain nicotine or tobacco, such as cigarettes and e-cigarettes. If you need help quitting, ask your health care provider.  Do not use any medicinal herbs or unprescribed drugs. These chemicals affect the formation and growth of the baby.  Do not douche or use tampons or scented sanitary pads.  Do not cross your legs for long periods of time.  To prepare for the arrival of your baby:  Take prenatal classes to understand, practice, and ask questions about labor and delivery.  Make a trial run to the hospital.  Visit the hospital and tour the maternity area.  Arrange for maternity or paternity leave through employers.  Arrange for family and friends to take care of pets while you are in the hospital.  Purchase a rear-facing car seat and make sure you know how to install it in your car.  Pack your hospital bag.  Prepare the baby's nursery. Make sure to remove all pillows and stuffed animals from the baby's crib to prevent suffocation.  Visit your dentist if you have not gone during your pregnancy. Use a soft toothbrush to brush your teeth and be gentle when you floss. Contact a health care provider if:  You are unsure if you are in labor or if your water has broken.  You become dizzy.  You have mild pelvic cramps, pelvic pressure, or nagging pain in your abdominal area.  You have lower back pain.  You have  persistent nausea, vomiting, or diarrhea.  You have an unusual or bad smelling vaginal discharge.  You have pain when you urinate. Get help right away if:  Your water breaks before 37 weeks.  You have regular contractions less than 5 minutes apart before 37 weeks.  You have a fever.  You are leaking fluid from your vagina.  You have spotting or bleeding from your vagina.  You have severe abdominal pain or cramping.  You have rapid weight   weight gain.  You have shortness of breath with chest pain.  You notice sudden or extreme swelling of your face, hands, ankles, feet, or legs.  Your baby makes fewer than 10 movements in 2 hours.  You have severe headaches that do not go away when you take medicine.  You have vision changes. Summary  The third trimester is from week 28 through week 40, months 7 through 9. The third trimester is a time when the unborn baby (fetus) is growing rapidly.  During the third trimester, your discomfort may increase as you and your baby continue to gain weight. You may have abdominal, leg, and back pain, sleeping problems, and an increased need to urinate.  During the third trimester your breasts will keep growing and they will continue to become tender. A yellow fluid (colostrum) may leak from your breasts. This is the first milk you are producing for your baby.  False labor is a condition in which you feel small, irregular tightenings of the muscles in the womb (contractions) that eventually go away. These are called Braxton Hicks contractions. Contractions may last for hours, days, or even weeks before true labor sets in.  Signs of labor can include: abdominal cramps; regular contractions that start at 10 minutes apart and become stronger and more frequent with time; watery or bloody mucus discharge that comes from the vagina; increased pelvic pressure and dull back pain; and leaking of amniotic fluid. This information is not intended to replace advice given  to you by your health care provider. Make sure you discuss any questions you have with your health care provider. Document Released: 11/04/2001 Document Revised: 04/17/2016 Document Reviewed: 01/11/2013 Elsevier Interactive Patient Education  2017 [employer]. Second Trimester of Pregnancy The second trimester is from week 14 through week 27 (months 4 through 6). The second trimester is often a time when you feel your best. Your body has adjusted to being pregnant, and you begin to feel better physically. Usually, morning sickness has lessened or quit completely, you may have more energy, and you may have an increase in appetite. The second trimester is also a time when the fetus is growing rapidly. At the end of the sixth month, the fetus is about 9 inches long and weighs about 1 pounds. You will likely begin to feel the baby move (quickening) between 16 and 20 weeks of pregnancy. Body changes during your second trimester Your body continues to go through many changes during your second trimester. The changes vary from woman to woman.  Your weight will continue to increase. You will notice your lower abdomen bulging out.  You may begin to get stretch marks on your hips, abdomen, and breasts.  You may develop headaches that can be relieved by medicines. The medicines should be approved by your health care provider.  You may urinate more often because the fetus is pressing on your bladder.  You may develop or continue to have heartburn as a result of your pregnancy.  You may develop constipation because certain hormones are causing the muscles that push waste through your intestines to slow down.  You may develop hemorrhoids or swollen, bulging veins (varicose veins).  You may have back pain. This is caused by:  Weight gain.  Pregnancy hormones that are relaxing the joints in your pelvis.  A shift in weight and the muscles that support your balance.  Your breasts will continue to grow  and they will continue to become tender.  Your gums may bleed  and may be sensitive to brushing and flossing.  Dark spots or blotches (chloasma, mask of pregnancy) may develop on your face. This will likely fade after the baby is born.  A dark line from your belly button to the pubic area (linea nigra) may appear. This will likely fade after the baby is born.  You may have changes in your hair. These can include thickening of your hair, rapid growth, and changes in texture. Some women also have hair loss during or after pregnancy, or hair that feels dry or thin. Your hair will most likely return to normal after your baby is born. What to expect at prenatal visits During a routine prenatal visit:  You will be weighed to make sure you and the fetus are growing normally.  Your blood pressure will be taken.  Your abdomen will be measured to track your baby's growth.  The fetal heartbeat will be listened to.  Any test results from the previous visit will be discussed. Your health care provider may ask you:  How you are feeling.  If you are feeling the baby move.  If you have had any abnormal symptoms, such as leaking fluid, bleeding, severe headaches, or abdominal cramping.  If you are using any tobacco products, including cigarettes, chewing tobacco, and electronic cigarettes.  If you have any questions. Other tests that may be performed during your second trimester include:  Blood tests that check for:  Low iron levels (anemia).  High blood sugar that affects pregnant women (gestational diabetes) between 93 and 28 weeks.  Rh antibodies. This is to check for a protein on red blood cells (Rh factor).  Urine tests to check for infections, diabetes, or protein in the urine.  An ultrasound to confirm the proper growth and development of the baby.  An amniocentesis to check for possible genetic problems.  Fetal screens for spina bifida and Down syndrome.  HIV (human  immunodeficiency virus) testing. Routine prenatal testing includes screening for HIV, unless you choose not to have this test. Follow these instructions at home: Medicines   Follow your health care provider's instructions regarding medicine use. Specific medicines may be either safe or unsafe to take during pregnancy.  Take a prenatal vitamin that contains at least 600 micrograms (mcg) of folic acid.  If you develop constipation, try taking a stool softener if your health care provider approves. Eating and drinking   Eat a balanced diet that includes fresh fruits and vegetables, whole grains, good sources of protein such as meat, eggs, or tofu, and low-fat dairy. Your health care provider will help you determine the amount of weight gain that is right for you.  Avoid raw meat and uncooked cheese. These carry germs that can cause birth defects in the baby.  If you have low calcium intake from food, talk to your health care provider about whether you should take a daily calcium supplement.  Limit foods that are high in fat and processed sugars, such as fried and sweet foods.  To prevent constipation:  Drink enough fluid to keep your urine clear or pale yellow.  Eat foods that are high in fiber, such as fresh fruits and vegetables, whole grains, and beans. Activity   Exercise only as directed by your health care provider. Most women can continue their usual exercise routine during pregnancy. Try to exercise for 30 minutes at least 5 days a week. Stop exercising if you experience uterine contractions.  Avoid heavy lifting, wear low heel shoes, and practice  good posture.  A sexual relationship may be continued unless your health care provider directs you otherwise. Relieving pain and discomfort   Wear a good support bra to prevent discomfort from breast tenderness.  Take warm sitz baths to soothe any pain or discomfort caused by hemorrhoids. Use hemorrhoid cream if your health care  provider approves.  Rest with your legs elevated if you have leg cramps or low back pain.  If you develop varicose veins, wear support hose. Elevate your feet for 15 minutes, 3-4 times a day. Limit salt in your diet. Prenatal Care   Write down your questions. Take them to your prenatal visits.  Keep all your prenatal visits as told by your health care provider. This is important. Safety   Wear your seat belt at all times when driving.  Make a list of emergency phone numbers, including numbers for family, friends, the hospital, and police and fire departments. General instructions   Ask your health care provider for a referral to a local prenatal education class. Begin classes no later than the beginning of month 6 of your pregnancy.  Ask for help if you have counseling or nutritional needs during pregnancy. Your health care provider can offer advice or refer you to specialists for help with various needs.  Do not use hot tubs, steam rooms, or saunas.  Do not douche or use tampons or scented sanitary pads.  Do not cross your legs for long periods of time.  Avoid cat litter boxes and soil used by cats. These carry germs that can cause birth defects in the baby and possibly loss of the fetus by miscarriage or stillbirth.  Avoid all smoking, herbs, alcohol, and unprescribed drugs. Chemicals in these products can affect the formation and growth of the baby.  Do not use any products that contain nicotine or tobacco, such as cigarettes and e-cigarettes. If you need help quitting, ask your health care provider.  Visit your dentist if you have not gone yet during your pregnancy. Use a soft toothbrush to brush your teeth and be gentle when you floss. Contact a health care provider if:  You have dizziness.  You have mild pelvic cramps, pelvic pressure, or nagging pain in the abdominal area.  You have persistent nausea, vomiting, or diarrhea.  You have a bad smelling vaginal  discharge.  You have pain when you urinate. Get help right away if:  You have a fever.  You are leaking fluid from your vagina.  You have spotting or bleeding from your vagina.  You have severe abdominal cramping or pain.  You have rapid weight gain or weight loss.  You have shortness of breath with chest pain.  You notice sudden or extreme swelling of your face, hands, ankles, feet, or legs.  You have not felt your baby move in over an hour.  You have severe headaches that do not go away when you take medicine.  You have vision changes. Summary  The second trimester is from week 14 through week 27 (months 4 through 6). It is also a time when the fetus is growing rapidly.  Your body goes through many changes during pregnancy. The changes vary from woman to woman.  Avoid all smoking, herbs, alcohol, and unprescribed drugs. These chemicals affect the formation and growth your baby.  Do not use any tobacco products, such as cigarettes, chewing tobacco, and e-cigarettes. If you need help quitting, ask your health care provider.  Contact your health care provider if you have  any questions. Keep all prenatal visits as told by your health care provider. This is important. This information is not intended to replace advice given to you by your health care provider. Make sure you discuss any questions you have with your health care provider. Document Released: 11/04/2001 Document Revised: 04/17/2016 Document Reviewed: 01/11/2013 Elsevier Interactive Patient Education  2017 [employer].

## 2017-03-20 LAB — CERVICOVAGINAL ANCILLARY ONLY
Chlamydia: NEGATIVE
Neisseria Gonorrhea: NEGATIVE

## 2017-04-16 ENCOUNTER — Other Ambulatory Visit: Payer: Self-pay | Admitting: Obstetrics and Gynecology

## 2017-04-16 ENCOUNTER — Ambulatory Visit (HOSPITAL_COMMUNITY)
Admission: RE | Admit: 2017-04-16 | Discharge: 2017-04-16 | Disposition: A | Discharge status: Home / Self Care | Payer: Medicaid Other | Source: Ambulatory Visit | Attending: Obstetrics and Gynecology | Admitting: Obstetrics and Gynecology

## 2017-04-16 DIAGNOSIS — Z362 Encounter for other antenatal screening follow-up: Secondary | ICD-10-CM | POA: Insufficient documentation

## 2017-04-16 DIAGNOSIS — Z3A23 23 weeks gestation of pregnancy: Secondary | ICD-10-CM | POA: Diagnosis not present

## 2017-04-16 DIAGNOSIS — Z3492 Encounter for supervision of normal pregnancy, unspecified, second trimester: Secondary | ICD-10-CM

## 2017-04-30 ENCOUNTER — Emergency Department (HOSPITAL_COMMUNITY)
Admission: EM | Admit: 2017-04-30 | Discharge: 2017-04-30 | Disposition: A | Discharge status: Home / Self Care | Payer: Medicaid Other | Attending: Emergency Medicine | Admitting: Emergency Medicine

## 2017-04-30 ENCOUNTER — Encounter (HOSPITAL_COMMUNITY): Payer: Self-pay

## 2017-04-30 DIAGNOSIS — J329 Chronic sinusitis, unspecified: Secondary | ICD-10-CM

## 2017-04-30 DIAGNOSIS — Z3A25 25 weeks gestation of pregnancy: Secondary | ICD-10-CM | POA: Insufficient documentation

## 2017-04-30 DIAGNOSIS — O99512 Diseases of the respiratory system complicating pregnancy, second trimester: Secondary | ICD-10-CM | POA: Insufficient documentation

## 2017-04-30 DIAGNOSIS — O9989 Other specified diseases and conditions complicating pregnancy, childbirth and the puerperium: Secondary | ICD-10-CM | POA: Insufficient documentation

## 2017-04-30 DIAGNOSIS — H9203 Otalgia, bilateral: Secondary | ICD-10-CM | POA: Insufficient documentation

## 2017-04-30 DIAGNOSIS — J019 Acute sinusitis, unspecified: Secondary | ICD-10-CM | POA: Diagnosis not present

## 2017-04-30 MED ORDER — ACETAMINOPHEN 500 MG PO TABS
1000.0000 mg | ORAL_TABLET | Freq: Once | ORAL | Status: AC
  Administered 2017-04-30: 1000 mg via ORAL
  Filled 2017-04-30: qty 2

## 2017-04-30 MED ORDER — DIPHENHYDRAMINE HCL 25 MG PO CAPS
50.0000 mg | ORAL_CAPSULE | Freq: Once | ORAL | Status: AC
  Administered 2017-04-30: 50 mg via ORAL
  Filled 2017-04-30: qty 2

## 2017-04-30 NOTE — ED Notes (Signed)
Pt states that she has pain to the left ear 10/10 that is throbbing that started 3 days ago. Pt states that she took tylenol @ 4pm that was not effect in managing pain. Pt is alert and oriented x 4 and erbally responsive. Pt is accompanied by significant other,.

## 2017-04-30 NOTE — Discharge Instructions (Signed)
You may take Tylenol 1000 mg every 6 hours as needed for pain. You may use over-the-counter Benadryl 50 mg every 8 hours as needed as well which may help with your her pain. These medications are safe in pregnancy. At this time you do not need any antibiotics. Your symptoms are likely caused by viral illness versus allergies.

## 2017-04-30 NOTE — ED Notes (Addendum)
Pt fetal tones are present. HR 134

## 2017-04-30 NOTE — ED Provider Notes (Signed)
By signing my name below, I, Modena JanskyAlbert Thayil, attest that this documentation has been prepared under the direction and in the presence of Julion Gatt, Layla MawKristen N, DO. Electronically Signed: Modena JanskyAlbert Thayil, Scribe. 04/30/2017. 1:50 AM.  TIME SEEN: 1:50 AM  CHIEF COMPLAINT: Otalgia  HPI:  HPI Comments: Cindy Ware I Coplen is a 21 y.o. female who presents to the Emergency Department complaining of constant moderate bilateral ear pain that started about 3 days ago. She is currently [redacted] weeks pregnant (3383w1d @ [id number]) with EDD 08/12/2017. She states her pain started when she was having URI-like symptoms and worsened today. She took 1000mg  acetaminophen with minimal relief at 4 PM yesterday. She describes the pain as worse on the right and radiating to her head. She reports associated sore throat and dry cough.  She states that she gets the same symptoms every year with change in seasons. Denies any vaginal bleeding/discharge or other complaints at this time. States she is feeling her baby move. No leaking fluid. No abdominal pain or contractions.   ROS: See HPI Constitutional: no fever  Eyes: no drainage  ENT: +ear pain, +sore throat, no runny nose   Cardiovascular:  no chest pain  Resp: +cough, no SOB  GI: no vomiting GU: no dysuria, no vaginal bleeding/discharge Integumentary: no rash  Allergy: no hives  Musculoskeletal: no leg swelling  Neurological: +headache, no slurred speech ROS otherwise negative  PAST MEDICAL HISTORY/PAST SURGICAL HISTORY:  Past Medical History:  Diagnosis Date  . Medical history non-contributory     MEDICATIONS:  Prior to Admission medications   Medication Sig Start Date End Date Taking? Authorizing Provider  Prenatal Vit-Fe Fumarate-FA (MULTIVITAMIN-PRENATAL) 27-0.8 MG TABS tablet Take 1 tablet by mouth daily at 12 noon.    [provider]    ALLERGIES:  No Known Allergies  SOCIAL HISTORY:  Social History  Substance Use Topics  . Smoking status: Never  Smoker  . Smokeless tobacco: Never Used  . Alcohol use No     Comment: before pregnancy     FAMILY HISTORY: Family History  Problem Relation Age of Onset  . Hypertension Mother   . Asthma Maternal Uncle     EXAM: BP 104/72 (BP Location: Left Arm)   Pulse (!) 110   Resp 18   Ht 5\' 3"  (1.6 m)   Wt 131 lb (59.4 kg)   LMP 11/05/2016 (Exact Date)   SpO2 98%   BMI 23.21 kg/m  CONSTITUTIONAL: Alert and oriented and responds appropriately to questions. Well-appearing; well-nourished HEAD: Normocephalic EYES: Conjunctivae clear, pupils appear equal, EOMI ENT: normal nose; moist mucous membranes; No pharyngeal erythema or petechiae, no tonsillar hypertrophy or exudate, no uvular deviation, no unilateral swelling, no trismus or drooling, no muffled voice, normal phonation, no stridor, no dental caries present, no drainable dental abscess noted, no Ludwig's angina, tongue sits flat in the bottom of the mouth, no angioedema, no facial erythema or warmth, no facial swelling; no pain with movement of the neck.  TMs are clear bilaterally without erythema, purulence, bulging, perforation, effusion.  No cerumen impaction or sign of foreign body in the external auditory canal. No inflammation, erythema or drainage from the external auditory canal. No signs of mastoiditis. No pain with manipulation of the pinna bilaterally. NECK: Supple, no meningismus, no nuchal rigidity, no LAD  CARD: RRR; S1 and S2 appreciated; no murmurs, no clicks, no rubs, no gallops RESP: Normal chest excursion without splinting or tachypnea; breath sounds clear and equal bilaterally; no wheezes, no rhonchi, no  rales, no hypoxia or respiratory distress, speaking full sentences ABD/GI: Normal bowel sounds; non-distended; soft, non-tender, no rebound, no guarding, no peritoneal signs, no hepatosplenomegaly BACK:  The back appears normal and is non-tender to palpation, there is no CVA tenderness EXT: Normal ROM in all joints;  non-tender to palpation; no edema; normal capillary refill; no cyanosis, no calf tenderness or swelling    SKIN: Normal color for age and race; warm; no rash NEURO: Moves all extremities equally PSYCH: The patient's mood and manner are appropriate. Grooming and personal hygiene are appropriate.  MEDICAL DECISION MAKING: Patient here with likely allergic sinusitis. Also could be viral illness but no sign of pneumonia, meningitis, pharyngitis, deep space neck infection, dental abscess, Ludwig's angina. No sign of otitis media, otitis externa or mastoiditis. I do not feel antibiotics will be helpful for her and have discussed that they could be harmful to her and her baby. Recommend she continue Tylenol for pain. Avoid NSAIDs. Have recommended she could use Benadryl as needed.  We have discussed the due to use of other antihistamine such as Zyrtec daily and Flonase but she states she would like to hold off on these medications given she is pregnant and I agree that this is a reasonable plan. I recommended PCP follow-up if symptoms continue. Fetal heart tones here are normal. She has no abdominal pain, contractions, vaginal bleeding, discharge or leaking fluids. She has an OB/GYN for follow-up.  At this time, I do not feel there is any life-threatening condition present. I have reviewed and discussed all results (EKG, imaging, lab, urine as appropriate) and exam findings with patient/family. I have reviewed nursing notes and appropriate previous records.  I feel the patient is safe to be discharged home without further emergent workup and can continue workup as an outpatient as needed. Discussed usual and customary return precautions. Patient/family verbalize understanding and are comfortable with this plan.  Outpatient follow-up has been provided if needed. All questions have been answered.  I personally performed the services described in this documentation, which was scribed in my presence. The recorded  information has been reviewed and is accurate.     Zed Wanninger, Layla Maw, DO 04/30/17 [phone]

## 2017-05-18 ENCOUNTER — Encounter: Payer: Self-pay | Admitting: Family Medicine

## 2017-05-18 ENCOUNTER — Encounter: Payer: Medicaid Other | Admitting: Family Medicine

## 2017-05-18 ENCOUNTER — Telehealth: Payer: Self-pay | Admitting: General Practice

## 2017-05-18 NOTE — Telephone Encounter (Signed)
Patient was a NO SHOW today.  Called patient to reschedule appointment.  Left message on voicemail for patient to give our office a call to schedule appt.

## 2017-05-18 NOTE — Progress Notes (Signed)
Patient did not keep appointment today in [facility]--She will be contacted to re-schedule. 

## 2017-05-19 ENCOUNTER — Ambulatory Visit (INDEPENDENT_AMBULATORY_CARE_PROVIDER_SITE_OTHER): Payer: Medicaid Other | Admitting: Obstetrics and Gynecology

## 2017-05-19 ENCOUNTER — Encounter: Payer: Self-pay | Admitting: Obstetrics and Gynecology

## 2017-05-19 VITALS — BP 111/65 | HR 101 | Wt 129.5 lb

## 2017-05-19 DIAGNOSIS — O360920 Maternal care for other rhesus isoimmunization, second trimester, not applicable or unspecified: Secondary | ICD-10-CM | POA: Diagnosis not present

## 2017-05-19 DIAGNOSIS — Z3482 Encounter for supervision of other normal pregnancy, second trimester: Secondary | ICD-10-CM

## 2017-05-19 DIAGNOSIS — Z23 Encounter for immunization: Secondary | ICD-10-CM | POA: Diagnosis not present

## 2017-05-19 DIAGNOSIS — Z3493 Encounter for supervision of normal pregnancy, unspecified, third trimester: Secondary | ICD-10-CM

## 2017-05-19 DIAGNOSIS — Z6791 Unspecified blood type, Rh negative: Secondary | ICD-10-CM

## 2017-05-19 DIAGNOSIS — O26893 Other specified pregnancy related conditions, third trimester: Secondary | ICD-10-CM

## 2017-05-19 MED ORDER — RHO D IMMUNE GLOBULIN 1500 UNIT/2ML IJ SOSY
300.0000 ug | PREFILLED_SYRINGE | Freq: Once | INTRAMUSCULAR | Status: AC
  Administered 2017-05-19: 300 ug via INTRAMUSCULAR

## 2017-05-19 NOTE — Progress Notes (Signed)
   PRENATAL VISIT NOTE  Subjective:  Cindy Ware is a 21 y.o. G3P1011 at 8965w6d being seen today for ongoing prenatal care.  She is currently monitored for the following issues for this low-risk pregnancy and has Supervision of low-risk pregnancy and Rh negative state in antepartum period on her problem list.  Patient reports no complaints.  Contractions: Not present. Vag. Bleeding: None.  Movement: Present. Denies leaking of fluid.   The following portions of the patient's history were reviewed and updated as appropriate: allergies, current medications, past family history, past medical history, past social history, past surgical history and problem list. Problem list updated.  Objective:   Vitals:   05/19/17 0844  BP: 111/65  Pulse: (!) 101  Weight: 129 lb 8 oz (58.7 kg)    Fetal Status: Fetal Heart Rate (bpm): 150   Movement: Present     General:  Alert, oriented and cooperative. Patient is in no acute distress.  Skin: Skin is warm and dry. No rash noted.   Cardiovascular: Normal heart rate noted  Respiratory: Normal respiratory effort, no problems with respiration noted  Abdomen: Soft, gravid, appropriate for gestational age. Pain/Pressure: Absent     Pelvic:  Cervical exam deferred        Extremities: Normal range of motion.  Edema: None  Mental Status: Normal mood and affect. Normal behavior. Normal judgment and thought content.   Assessment and Plan:  Pregnancy: G3P1011 at 6765w6d  1. Supervision of low-risk pregnancy, third trimester  doing well no complaints - Glucose Tolerance, 2 Hours w/1 Hour - CBC - HIV antibody (with reflex) - RPR  2. Need for Tdap vaccination - Tdap vaccine greater than or equal to 7yo IM  3. Rh negative status during pregnancy in third trimester Rhogam today  Preterm labor symptoms and general obstetric precautions including but not limited to vaginal bleeding, contractions, leaking of fluid and fetal movement were reviewed in  detail with the patient. Please refer to After Visit Summary for other counseling recommendations.  No Follow-up on file.   Ernestina PennaNicholas Noris Kulinski, MD

## 2017-05-19 NOTE — Addendum Note (Signed)
Addended by: Clearnce SorrelPICKARD, JILL S on: 05/19/2017 10:46 AM   Modules accepted: Orders

## 2017-05-19 NOTE — Patient Instructions (Signed)
Third Trimester of Pregnancy The third trimester is from week 28 through week 40 (months 7 through 9). The third trimester is a time when the unborn baby (fetus) is growing rapidly. At the end of the ninth month, the fetus is about 20 inches in length and weighs 6-10 pounds. Body changes during your third trimester Your body will continue to go through many changes during pregnancy. The changes vary from woman to woman. During the third trimester:  Your weight will continue to increase. You can expect to gain 25-35 pounds (11-16 kg) by the end of the pregnancy.  You may begin to get stretch marks on your hips, abdomen, and breasts.  You may urinate more often because the fetus is moving lower into your pelvis and pressing on your bladder.  You may develop or continue to have heartburn. This is caused by increased hormones that slow down muscles in the digestive tract.  You may develop or continue to have constipation because increased hormones slow digestion and cause the muscles that push waste through your intestines to relax.  You may develop hemorrhoids. These are swollen veins (varicose veins) in the rectum that can itch or be painful.  You may develop swollen, bulging veins (varicose veins) in your legs.  You may have increased body aches in the pelvis, back, or thighs. This is due to weight gain and increased hormones that are relaxing your joints.  You may have changes in your hair. These can include thickening of your hair, rapid growth, and changes in texture. Some women also have hair loss during or after pregnancy, or hair that feels dry or thin. Your hair will most likely return to normal after your baby is born.  Your breasts will continue to grow and they will continue to become tender. A yellow fluid (colostrum) may leak from your breasts. This is the first milk you are producing for your baby.  Your belly button may stick out.  You may notice more swelling in your hands,  face, or ankles.  You may have increased tingling or numbness in your hands, arms, and legs. The skin on your belly may also feel numb.  You may feel short of breath because of your expanding uterus.  You may have more problems sleeping. This can be caused by the size of your belly, increased need to urinate, and an increase in your body's metabolism.  You may notice the fetus "dropping," or moving lower in your abdomen (lightening).  You may have increased vaginal discharge.  You may notice your joints feel loose and you may have pain around your pelvic bone.  What to expect at prenatal visits You will have prenatal exams every 2 weeks until week 36. Then you will have weekly prenatal exams. During a routine prenatal visit:  You will be weighed to make sure you and the baby are growing normally.  Your blood pressure will be taken.  Your abdomen will be measured to track your baby's growth.  The fetal heartbeat will be listened to.  Any test results from the previous visit will be discussed.  You may have a cervical check near your due date to see if your cervix has softened or thinned (effaced).  You will be tested for Group B streptococcus. This happens between 35 and 37 weeks.  Your health care provider may ask you:  What your birth plan is.  How you are feeling.  If you are feeling the baby move.  If you have had   any abnormal symptoms, such as leaking fluid, bleeding, severe headaches, or abdominal cramping.  If you are using any tobacco products, including cigarettes, chewing tobacco, and electronic cigarettes.  If you have any questions.  Other tests or screenings that may be performed during your third trimester include:  Blood tests that check for low iron levels (anemia).  Fetal testing to check the health, activity level, and growth of the fetus. Testing is done if you have certain medical conditions or if there are problems during the  pregnancy.  Nonstress test (NST). This test checks the health of your baby to make sure there are no signs of problems, such as the baby not getting enough oxygen. During this test, a belt is placed around your belly. The baby is made to move, and its heart rate is monitored during movement.  What is false labor? False labor is a condition in which you feel small, irregular tightenings of the muscles in the womb (contractions) that usually go away with rest, changing position, or drinking water. These are called Braxton Hicks contractions. Contractions may last for hours, days, or even weeks before true labor sets in. If contractions come at regular intervals, become more frequent, increase in intensity, or become painful, you should see your health care provider. What are the signs of labor?  Abdominal cramps.  Regular contractions that start at 10 minutes apart and become stronger and more frequent with time.  Contractions that start on the top of the uterus and spread down to the lower abdomen and back.  Increased pelvic pressure and dull back pain.  A watery or bloody mucus discharge that comes from the vagina.  Leaking of amniotic fluid. This is also known as your "water breaking." It could be a slow trickle or a gush. Let your health care provider know if it has a color or strange odor. If you have any of these signs, call your health care provider right away, even if it is before your due date. Follow these instructions at home: Medicines  Follow your health care provider's instructions regarding medicine use. Specific medicines may be either safe or unsafe to take during pregnancy.  Take a prenatal vitamin that contains at least 600 micrograms (mcg) of folic acid.  If you develop constipation, try taking a stool softener if your health care provider approves. Eating and drinking  Eat a balanced diet that includes fresh fruits and vegetables, whole grains, good sources of protein  such as meat, eggs, or tofu, and low-fat dairy. Your health care provider will help you determine the amount of weight gain that is right for you.  Avoid raw meat and uncooked cheese. These carry germs that can cause birth defects in the baby.  If you have low calcium intake from food, talk to your health care provider about whether you should take a daily calcium supplement.  Eat four or five small meals rather than three large meals a day.  Limit foods that are high in fat and processed sugars, such as fried and sweet foods.  To prevent constipation: ? Drink enough fluid to keep your urine clear or pale yellow. ? Eat foods that are high in fiber, such as fresh fruits and vegetables, whole grains, and beans. Activity  Exercise only as directed by your health care provider. Most women can continue their usual exercise routine during pregnancy. Try to exercise for 30 minutes at least 5 days a week. Stop exercising if you experience uterine contractions.  Avoid heavy   lifting.  Do not exercise in extreme heat or humidity, or at high altitudes.  Wear low-heel, comfortable shoes.  Practice good posture.  You may continue to have sex unless your health care provider tells you otherwise. Relieving pain and discomfort  Take frequent breaks and rest with your legs elevated if you have leg cramps or low back pain.  Take warm sitz baths to soothe any pain or discomfort caused by hemorrhoids. Use hemorrhoid cream if your health care provider approves.  Wear a good support bra to prevent discomfort from breast tenderness.  If you develop varicose veins: ? Wear support pantyhose or compression stockings as told by your healthcare provider. ? Elevate your feet for 15 minutes, 3-4 times a day. Prenatal care  Write down your questions. Take them to your prenatal visits.  Keep all your prenatal visits as told by your health care provider. This is important. Safety  Wear your seat belt at  all times when driving.  Make a list of emergency phone numbers, including numbers for family, friends, the hospital, and police and fire departments. General instructions  Avoid cat litter boxes and soil used by cats. These carry germs that can cause birth defects in the baby. If you have a cat, ask someone to clean the litter box for you.  Do not travel far distances unless it is absolutely necessary and only with the approval of your health care provider.  Do not use hot tubs, steam rooms, or saunas.  Do not drink alcohol.  Do not use any products that contain nicotine or tobacco, such as cigarettes and e-cigarettes. If you need help quitting, ask your health care provider.  Do not use any medicinal herbs or unprescribed drugs. These chemicals affect the formation and growth of the baby.  Do not douche or use tampons or scented sanitary pads.  Do not cross your legs for long periods of time.  To prepare for the arrival of your baby: ? Take prenatal classes to understand, practice, and ask questions about labor and delivery. ? Make a trial run to the hospital. ? Visit the hospital and tour the maternity area. ? Arrange for maternity or paternity leave through employers. ? Arrange for family and friends to take care of pets while you are in the hospital. ? Purchase a rear-facing car seat and make sure you know how to install it in your car. ? Pack your hospital bag. ? Prepare the baby's nursery. Make sure to remove all pillows and stuffed animals from the baby's crib to prevent suffocation.  Visit your dentist if you have not gone during your pregnancy. Use a soft toothbrush to brush your teeth and be gentle when you floss. Contact a health care provider if:  You are unsure if you are in labor or if your water has broken.  You become dizzy.  You have mild pelvic cramps, pelvic pressure, or nagging pain in your abdominal area.  You have lower back pain.  You have persistent  nausea, vomiting, or diarrhea.  You have an unusual or bad smelling vaginal discharge.  You have pain when you urinate. Get help right away if:  Your water breaks before 37 weeks.  You have regular contractions less than 5 minutes apart before 37 weeks.  You have a fever.  You are leaking fluid from your vagina.  You have spotting or bleeding from your vagina.  You have severe abdominal pain or cramping.  You have rapid weight loss or weight gain.    You have shortness of breath with chest pain.  You notice sudden or extreme swelling of your face, hands, ankles, feet, or legs.  Your baby makes fewer than 10 movements in 2 hours.  You have severe headaches that do not go away when you take medicine.  You have vision changes. Summary  The third trimester is from week 28 through week 40, months 7 through 9. The third trimester is a time when the unborn baby (fetus) is growing rapidly.  During the third trimester, your discomfort may increase as you and your baby continue to gain weight. You may have abdominal, leg, and back pain, sleeping problems, and an increased need to urinate.  During the third trimester your breasts will keep growing and they will continue to become tender. A yellow fluid (colostrum) may leak from your breasts. This is the first milk you are producing for your baby.  False labor is a condition in which you feel small, irregular tightenings of the muscles in the womb (contractions) that eventually go away. These are called Braxton Hicks contractions. Contractions may last for hours, days, or even weeks before true labor sets in.  Signs of labor can include: abdominal cramps; regular contractions that start at 10 minutes apart and become stronger and more frequent with time; watery or bloody mucus discharge that comes from the vagina; increased pelvic pressure and dull back pain; and leaking of amniotic fluid. This information is not intended to replace advice  given to you by your health care provider. Make sure you discuss any questions you have with your health care provider. Document Released: 11/04/2001 Document Revised: 04/17/2016 Document Reviewed: 01/11/2013 Elsevier Interactive Patient Education  2017 [employer]  

## 2017-05-20 LAB — CBC
HEMATOCRIT: 34 % (ref 34.0–46.6)
HEMOGLOBIN: 11.4 g/dL (ref 11.1–15.9)
MCH: 31.8 pg (ref 26.6–33.0)
MCHC: 33.5 g/dL (ref 31.5–35.7)
MCV: 95 fL (ref 79–97)
Platelets: 257 10*3/uL (ref 150–379)
RBC: 3.59 x10E6/uL — ABNORMAL LOW (ref 3.77–5.28)
RDW: 13.3 % (ref 12.3–15.4)
WBC: 9 10*3/uL (ref 3.4–10.8)

## 2017-05-20 LAB — GLUCOSE TOLERANCE, 2 HOURS W/ 1HR
GLUCOSE, 2 HOUR: 91 mg/dL (ref 65–152)
Glucose, 1 hour: 108 mg/dL (ref 65–179)
Glucose, Fasting: 67 mg/dL (ref 65–91)

## 2017-05-20 LAB — RPR: RPR: NONREACTIVE

## 2017-05-20 LAB — HIV ANTIBODY (ROUTINE TESTING W REFLEX): HIV Screen 4th Generation wRfx: NONREACTIVE

## 2017-06-16 ENCOUNTER — Encounter: Payer: Self-pay | Admitting: Advanced Practice Midwife

## 2017-06-16 ENCOUNTER — Ambulatory Visit (INDEPENDENT_AMBULATORY_CARE_PROVIDER_SITE_OTHER): Payer: Medicaid Other | Admitting: Advanced Practice Midwife

## 2017-06-16 DIAGNOSIS — O26899 Other specified pregnancy related conditions, unspecified trimester: Principal | ICD-10-CM

## 2017-06-16 DIAGNOSIS — Z6791 Unspecified blood type, Rh negative: Secondary | ICD-10-CM

## 2017-06-16 DIAGNOSIS — Z3493 Encounter for supervision of normal pregnancy, unspecified, third trimester: Secondary | ICD-10-CM

## 2017-06-16 NOTE — Progress Notes (Signed)
   PRENATAL VISIT NOTE  Subjective:  Cindy Ware is a 21 y.o. G3P1011 at 8852w6d being seen today for ongoing prenatal care.  She is currently monitored for the following issues for this low-risk pregnancy and has Supervision of low-risk pregnancy and Rh negative state in antepartum period on her problem list.  Patient reports increased pelvic pressure and occassional nump sensations in groin, worse since yesterday. Last IC last night. .  Contractions: Not present.  .  Movement: Present. Denies leaking of fluid, VB, urinary complaints, vaginal discharge.   The following portions of the patient's history were reviewed and updated as appropriate: allergies, current medications, past family history, past medical history, past social history, past surgical history and problem list. Problem list updated.  Objective:   Vitals:   06/16/17 1310  BP: 103/64  Pulse: (!) 102  Weight: 136 lb 14.4 oz (62.1 kg)    Fetal Status: Fetal Heart Rate (bpm): 154 Fundal Height: 32 cm Movement: Present  Presentation: Vertex  General:  Alert, oriented and cooperative. Patient is in no acute distress.  Skin: Skin is warm and dry. No rash noted.   Cardiovascular: Normal heart rate noted  Respiratory: Normal respiratory effort, no problems with respiration noted  Abdomen: Soft, gravid, appropriate for gestational age.  Pain/Pressure: Absent     Pelvic: Cervical exam performed Dilation: Closed Effacement (%): 0 Station: Ballotable  Extremities: Normal range of motion.  Edema: None  Mental Status:  Normal mood and affect. Normal behavior. Normal judgment and thought content.   Assessment and Plan:  Pregnancy: G3P1011 at 7852w6d  1. Rh negative state in antepartum period   2. Encounter for supervision of low-risk pregnancy in third trimester   Preterm labor symptoms and general obstetric precautions including but not limited to vaginal bleeding, contractions, leaking of fluid and fetal movement were  reviewed in detail with the patient. Please refer to After Visit Summary for other counseling recommendations.  Return in about 4 weeks (around 07/14/2017) for ROB/GBS.  Plans OCPs   Dorathy KinsmanVirginia Marrah Vanevery, CNM

## 2017-06-16 NOTE — Patient Instructions (Signed)

## 2017-07-06 ENCOUNTER — Telehealth: Payer: Self-pay | Admitting: *Deleted

## 2017-07-06 NOTE — Telephone Encounter (Signed)
Patient called to let us know she is having some heaviness and pain in her lower pelvis when she walks. Discussed her baby moving down into the lower uterine segment. She rides the bus to work and she walks about 10 minutes. Discussed getting a support belt- also discussed staying hydrated in this weather and when she should stop working. (She only works 4 hours) Told patient even if she is [employer]aby scripts- if she feels she needs to be seen she should call for appointment.

## 2017-07-07 ENCOUNTER — Telehealth: Payer: Self-pay | Admitting: *Deleted

## 2017-07-07 NOTE — Telephone Encounter (Signed)
Pt left message yesterday stating that after she spoke with the nurse, she remembered something else she wanted to tell us. She stated that she is having itching @ her clitoris. She denies vaginal d/c or odor and states she has no itching inside the vagina - just @ the clitoris on the outside. Please call back

## 2017-07-13 ENCOUNTER — Ambulatory Visit (INDEPENDENT_AMBULATORY_CARE_PROVIDER_SITE_OTHER): Payer: Medicaid Other | Admitting: Medical

## 2017-07-13 ENCOUNTER — Encounter: Payer: Self-pay | Admitting: Medical

## 2017-07-13 ENCOUNTER — Other Ambulatory Visit (HOSPITAL_COMMUNITY)
Admission: RE | Admit: 2017-07-13 | Discharge: 2017-07-13 | Disposition: A | Discharge status: Home / Self Care | Payer: Medicaid Other | Source: Ambulatory Visit | Attending: Medical | Admitting: Medical

## 2017-07-13 VITALS — BP 113/76 | HR 100 | Wt 139.2 lb

## 2017-07-13 DIAGNOSIS — B3731 Acute candidiasis of vulva and vagina: Secondary | ICD-10-CM

## 2017-07-13 DIAGNOSIS — Z3493 Encounter for supervision of normal pregnancy, unspecified, third trimester: Secondary | ICD-10-CM

## 2017-07-13 DIAGNOSIS — Z113 Encounter for screening for infections with a predominantly sexual mode of transmission: Secondary | ICD-10-CM

## 2017-07-13 DIAGNOSIS — Z331 Pregnant state, incidental: Secondary | ICD-10-CM | POA: Diagnosis not present

## 2017-07-13 DIAGNOSIS — B373 Candidiasis of vulva and vagina: Secondary | ICD-10-CM | POA: Diagnosis not present

## 2017-07-13 MED ORDER — TERCONAZOLE 0.8 % VA CREA: 1.0000 | TOPICAL_CREAM | Freq: Every day | VAGINAL | 0 refills | Status: DC

## 2017-07-13 NOTE — Progress Notes (Signed)
   PRENATAL VISIT NOTE  Subjective:  Cindy Ware is a 21 y.o. G3P1011 at [redacted]w[redacted]d being seen today for ongoing prenatal care.  She is currently monitored for the following issues for this low-risk pregnancy and has Supervision of low-risk pregnancy and Rh negative state in antepartum period on her problem list.  Patient reports pelvic pain.  Contractions: Irregular. Vag. Bleeding: None.  Movement: Present. Denies leaking of fluid.   The following portions of the patient's history were reviewed and updated as appropriate: allergies, current medications, past family history, past medical history, past social history, past surgical history and problem list. Problem list updated.  Objective:   Vitals:   07/13/17 1428  BP: 113/76  Pulse: 100  Weight: 139 lb 3.2 oz (63.1 kg)    Fetal Status: Fetal Heart Rate (bpm): 155 Fundal Height: 36 cm Movement: Present  Presentation: Vertex  General:  Alert, oriented and cooperative. Patient is in no acute distress.  Skin: Skin is warm and dry. No rash noted.   Cardiovascular: Normal heart rate noted  Respiratory: Normal respiratory effort, no problems with respiration noted  Abdomen: Soft, gravid, appropriate for gestational age.  Pain/Pressure: Present     Pelvic: Cervical exam performed Dilation: Closed Effacement (%): 0 Station: -3  Extremities: Normal range of motion.  Edema: None  Mental Status:  Normal mood and affect. Normal behavior. Normal judgment and thought content.   Assessment and Plan:  Pregnancy: G3P1011 at [redacted]w[redacted]d  1. Supervision of low-risk pregnancy, third trimester - Culture, beta strep (group b only) - Cervicovaginal ancillary only  2. Yeast vaginitis, clinical - terconazole (TERAZOL 3) 0.8 % vaginal cream; Place 1 applicator vaginally at bedtime.  Dispense: 20 g; Refill: 0  3. Round ligament pain - Advised to try abdominal binder, again  Preterm labor symptoms and general obstetric precautions including but not  limited to vaginal bleeding, contractions, leaking of fluid and fetal movement were reviewed in detail with the patient. Please refer to After Visit Summary for other counseling recommendations.  Return in about 2 weeks (around 07/27/2017) for Babyscripts, LOB.   Vonzella Nipple, PA-C

## 2017-07-13 NOTE — Telephone Encounter (Signed)
I called and left a message I was calling back to discuss her phone call/ concerns. Please discuss at your visit today. I also put notes on todays visit notes.

## 2017-07-13 NOTE — Patient Instructions (Signed)
Fetal Movement Counts °Patient Name: ________________________________________________ Patient Due Date: ____________________ °What is a fetal movement count? °A fetal movement count is the number of times that you feel your baby move during a certain amount of time. This may also be called a fetal kick count. A fetal movement count is recommended for every pregnant woman. You may be asked to start counting fetal movements as early as week 28 of your pregnancy. °Pay attention to when your baby is most active. You may notice your baby's sleep and wake cycles. You may also notice things that make your baby move more. You should do a fetal movement count: °· When your baby is normally most active. °· At the same time each day. ° °A good time to count movements is while you are resting, after having something to eat and drink. °How do I count fetal movements? °1. Find a quiet, comfortable area. Sit, or lie down on your side. °2. Write down the date, the start time and stop time, and the number of movements that you felt between those two times. Take this information with you to your health care visits. °3. For 2 hours, count kicks, flutters, swishes, rolls, and jabs. You should feel at least 10 movements during 2 hours. °4. You may stop counting after you have felt 10 movements. °5. If you do not feel 10 movements in 2 hours, have something to eat and drink. Then, keep resting and counting for 1 hour. If you feel at least 4 movements during that hour, you may stop counting. °Contact a health care provider if: °· You feel fewer than 4 movements in 2 hours. °· Your baby is not moving like he or she usually does. °Date: ____________ Start time: ____________ Stop time: ____________ Movements: ____________ °Date: ____________ Start time: ____________ Stop time: ____________ Movements: ____________ °Date: ____________ Start time: ____________ Stop time: ____________ Movements: ____________ °Date: ____________ Start time:  ____________ Stop time: ____________ Movements: ____________ °Date: ____________ Start time: ____________ Stop time: ____________ Movements: ____________ °Date: ____________ Start time: ____________ Stop time: ____________ Movements: ____________ °Date: ____________ Start time: ____________ Stop time: ____________ Movements: ____________ °Date: ____________ Start time: ____________ Stop time: ____________ Movements: ____________ °Date: ____________ Start time: ____________ Stop time: ____________ Movements: ____________ °This information is not intended to replace advice given to you by your health care provider. Make sure you discuss any questions you have with your health care provider. °Document Released: 12/10/2006 Document Revised: 07/09/2016 Document Reviewed: 12/20/2015 °Elsevier Interactive Patient Education © 2018 [employer] °Braxton Hicks Contractions °Contractions of the uterus can occur throughout pregnancy, but they are not always a sign that you are in labor. You may have practice contractions called Braxton Hicks contractions. These false labor contractions are sometimes confused with true labor. °What are Braxton Hicks contractions? °Braxton Hicks contractions are tightening movements that occur in the muscles of the uterus before labor. Unlike true labor contractions, these contractions do not result in opening (dilation) and thinning of the cervix. Toward the end of pregnancy (32-34 weeks), Braxton Hicks contractions can happen more often and may become stronger. These contractions are sometimes difficult to tell apart from true labor because they can be very uncomfortable. You should not feel embarrassed if you go to the hospital with false labor. °Sometimes, the only way to tell if you are in true labor is for your health care provider to look for changes in the cervix. The health care provider will do a physical exam and may monitor your contractions. If   you are not in true labor, the exam  should show that your cervix is not dilating and your water has not broken. °If there are no prenatal problems or other health problems associated with your pregnancy, it is completely safe for you to be sent home with false labor. You may continue to have Braxton Hicks contractions until you go into true labor. °How can I tell the difference between true labor and false labor? °· Differences °? False labor °? Contractions last 30-70 seconds.: Contractions are usually shorter and not as strong as true labor contractions. °? Contractions become very regular.: Contractions are usually irregular. °? Discomfort is usually felt in the top of the uterus, and it spreads to the lower abdomen and low back.: Contractions are often felt in the front of the lower abdomen and in the groin. °? Contractions do not go away with walking.: Contractions may go away when you walk around or change positions while lying down. °? Contractions usually become more intense and increase in frequency.: Contractions get weaker and are shorter-lasting as time goes on. °? The cervix dilates and gets thinner.: The cervix usually does not dilate or become thin. °Follow these instructions at home: °· Take over-the-counter and prescription medicines only as told by your health care provider. °· Keep up with your usual exercises and follow other instructions from your health care provider. °· Eat and drink lightly if you think you are going into labor. °· If Braxton Hicks contractions are making you uncomfortable: °? Change your position from lying down or resting to walking, or change from walking to resting. °? Sit and rest in a tub of warm water. °? Drink enough fluid to keep your urine clear or pale yellow. Dehydration may cause these contractions. °? Do slow and deep breathing several times an hour. °· Keep all follow-up prenatal visits as told by your health care provider. This is important. °Contact a health care provider if: °· You have a  fever. °· You have continuous pain in your abdomen. °Get help right away if: °· Your contractions become stronger, more regular, and closer together. °· You have fluid leaking or gushing from your vagina. °· You pass blood-tinged mucus (bloody show). °· You have bleeding from your vagina. °· You have low back pain that you never had before. °· You feel your baby’s head pushing down and causing pelvic pressure. °· Your baby is not moving inside you as much as it used to. °Summary °· Contractions that occur before labor are called Braxton Hicks contractions, false labor, or practice contractions. °· Braxton Hicks contractions are usually shorter, weaker, farther apart, and less regular than true labor contractions. True labor contractions usually become progressively stronger and regular and they become more frequent. °· Manage discomfort from Braxton Hicks contractions by changing position, resting in a warm bath, drinking plenty of water, or practicing deep breathing. °This information is not intended to replace advice given to you by your health care provider. Make sure you discuss any questions you have with your health care provider. °Document Released: 11/10/2005 Document Revised: 09/29/2016 Document Reviewed: 09/29/2016 °Elsevier Interactive Patient Education © 2017 [employer] ° °

## 2017-07-13 NOTE — Progress Notes (Signed)
Patient here today for Routine OB visit. Patient complains of thick white discharge and itching in vaginal area outside.

## 2017-07-14 LAB — CERVICOVAGINAL ANCILLARY ONLY
BACTERIAL VAGINITIS: NEGATIVE
CANDIDA VAGINITIS: POSITIVE — AB
CHLAMYDIA, DNA PROBE: NEGATIVE
NEISSERIA GONORRHEA: NEGATIVE
Trichomonas: NEGATIVE

## 2017-07-16 LAB — CULTURE, BETA STREP (GROUP B ONLY): STREP GP B CULTURE: POSITIVE — AB

## 2017-07-16 NOTE — Telephone Encounter (Signed)
Pt seen in office as scheduled on 8/20. She had sx of vaginal yeast and was prescribed Terazol vaginal cream.

## 2017-07-28 ENCOUNTER — Ambulatory Visit (INDEPENDENT_AMBULATORY_CARE_PROVIDER_SITE_OTHER): Payer: Medicaid Other | Admitting: Medical

## 2017-07-28 VITALS — BP 113/72 | HR 113 | Wt 135.0 lb

## 2017-07-28 DIAGNOSIS — Z3493 Encounter for supervision of normal pregnancy, unspecified, third trimester: Secondary | ICD-10-CM

## 2017-07-28 DIAGNOSIS — B3731 Acute candidiasis of vulva and vagina: Secondary | ICD-10-CM

## 2017-07-28 DIAGNOSIS — B373 Candidiasis of vulva and vagina: Secondary | ICD-10-CM

## 2017-07-28 NOTE — Progress Notes (Signed)
   PRENATAL VISIT NOTE  Subjective:  Cindy Ware is a 21 y.o. G3P1011 at 7576w6d being seen today for ongoing prenatal care.  She is currently monitored for the following issues for this low-risk pregnancy and has Supervision of low-risk pregnancy and Rh negative state in antepartum period on her problem list.  Patient reports no complaints.  Contractions: Irregular. Vag. Bleeding: None.  Movement: Present. Denies leaking of fluid.   The following portions of the patient's history were reviewed and updated as appropriate: allergies, current medications, past family history, past medical history, past social history, past surgical history and problem list. Problem list updated.  Objective:   Vitals:   07/28/17 1333  BP: 113/72  Pulse: (!) 113  Weight: 135 lb (61.2 kg)    Fetal Status: Fetal Heart Rate (bpm): 146 Fundal Height: 36 cm Movement: Present     General:  Alert, oriented and cooperative. Patient is in no acute distress.  Skin: Skin is warm and dry. No rash noted.   Cardiovascular: Normal heart rate noted  Respiratory: Normal respiratory effort, no problems with respiration noted  Abdomen: Soft, gravid, appropriate for gestational age.  Pain/Pressure: Present     Pelvic: Cervical exam deferred        Extremities: Normal range of motion.  Edema: None  Mental Status:  Normal mood and affect. Normal behavior. Normal judgment and thought content.   Assessment and Plan:  Pregnancy: G3P1011 at 1876w6d  1. Supervision of low-risk pregnancy, third trimester - Doing well - Viral URI, taking Mucinex  2. Vaginal yeast infection - Treated at last visit with Terazol, symptoms have resolved  Term labor symptoms and general obstetric precautions including but not limited to vaginal bleeding, contractions, leaking of fluid and fetal movement were reviewed in detail with the patient. Please refer to After Visit Summary for other counseling recommendations.  Return in about 1 week  (around 08/04/2017) for LOB.   Vonzella NippleJulie Wenzel, PA-C

## 2017-07-28 NOTE — Patient Instructions (Signed)
Fetal Movement Counts °Patient Name: ________________________________________________ Patient Due Date: ____________________ °What is a fetal movement count? °A fetal movement count is the number of times that you feel your baby move during a certain amount of time. This may also be called a fetal kick count. A fetal movement count is recommended for every pregnant woman. You may be asked to start counting fetal movements as early as week 28 of your pregnancy. °Pay attention to when your baby is most active. You may notice your baby's sleep and wake cycles. You may also notice things that make your baby move more. You should do a fetal movement count: °· When your baby is normally most active. °· At the same time each day. ° °A good time to count movements is while you are resting, after having something to eat and drink. °How do I count fetal movements? °1. Find a quiet, comfortable area. Sit, or lie down on your side. °2. Write down the date, the start time and stop time, and the number of movements that you felt between those two times. Take this information with you to your health care visits. °3. For 2 hours, count kicks, flutters, swishes, rolls, and jabs. You should feel at least 10 movements during 2 hours. °4. You may stop counting after you have felt 10 movements. °5. If you do not feel 10 movements in 2 hours, have something to eat and drink. Then, keep resting and counting for 1 hour. If you feel at least 4 movements during that hour, you may stop counting. °Contact a health care provider if: °· You feel fewer than 4 movements in 2 hours. °· Your baby is not moving like he or she usually does. °Date: ____________ Start time: ____________ Stop time: ____________ Movements: ____________ °Date: ____________ Start time: ____________ Stop time: ____________ Movements: ____________ °Date: ____________ Start time: ____________ Stop time: ____________ Movements: ____________ °Date: ____________ Start time:  ____________ Stop time: ____________ Movements: ____________ °Date: ____________ Start time: ____________ Stop time: ____________ Movements: ____________ °Date: ____________ Start time: ____________ Stop time: ____________ Movements: ____________ °Date: ____________ Start time: ____________ Stop time: ____________ Movements: ____________ °Date: ____________ Start time: ____________ Stop time: ____________ Movements: ____________ °Date: ____________ Start time: ____________ Stop time: ____________ Movements: ____________ °This information is not intended to replace advice given to you by your health care provider. Make sure you discuss any questions you have with your health care provider. °Document Released: 12/10/2006 Document Revised: 07/09/2016 Document Reviewed: 12/20/2015 °Elsevier Interactive Patient Education © 2018 [employer] °Braxton Hicks Contractions °Contractions of the uterus can occur throughout pregnancy, but they are not always a sign that you are in labor. You may have practice contractions called Braxton Hicks contractions. These false labor contractions are sometimes confused with true labor. °What are Braxton Hicks contractions? °Braxton Hicks contractions are tightening movements that occur in the muscles of the uterus before labor. Unlike true labor contractions, these contractions do not result in opening (dilation) and thinning of the cervix. Toward the end of pregnancy (32-34 weeks), Braxton Hicks contractions can happen more often and may become stronger. These contractions are sometimes difficult to tell apart from true labor because they can be very uncomfortable. You should not feel embarrassed if you go to the hospital with false labor. °Sometimes, the only way to tell if you are in true labor is for your health care provider to look for changes in the cervix. The health care provider will do a physical exam and may monitor your contractions. If   you are not in true labor, the exam  should show that your cervix is not dilating and your water has not broken. °If there are no prenatal problems or other health problems associated with your pregnancy, it is completely safe for you to be sent home with false labor. You may continue to have Braxton Hicks contractions until you go into true labor. °How can I tell the difference between true labor and false labor? °· Differences °? False labor °? Contractions last 30-70 seconds.: Contractions are usually shorter and not as strong as true labor contractions. °? Contractions become very regular.: Contractions are usually irregular. °? Discomfort is usually felt in the top of the uterus, and it spreads to the lower abdomen and low back.: Contractions are often felt in the front of the lower abdomen and in the groin. °? Contractions do not go away with walking.: Contractions may go away when you walk around or change positions while lying down. °? Contractions usually become more intense and increase in frequency.: Contractions get weaker and are shorter-lasting as time goes on. °? The cervix dilates and gets thinner.: The cervix usually does not dilate or become thin. °Follow these instructions at home: °· Take over-the-counter and prescription medicines only as told by your health care provider. °· Keep up with your usual exercises and follow other instructions from your health care provider. °· Eat and drink lightly if you think you are going into labor. °· If Braxton Hicks contractions are making you uncomfortable: °? Change your position from lying down or resting to walking, or change from walking to resting. °? Sit and rest in a tub of warm water. °? Drink enough fluid to keep your urine clear or pale yellow. Dehydration may cause these contractions. °? Do slow and deep breathing several times an hour. °· Keep all follow-up prenatal visits as told by your health care provider. This is important. °Contact a health care provider if: °· You have a  fever. °· You have continuous pain in your abdomen. °Get help right away if: °· Your contractions become stronger, more regular, and closer together. °· You have fluid leaking or gushing from your vagina. °· You pass blood-tinged mucus (bloody show). °· You have bleeding from your vagina. °· You have low back pain that you never had before. °· You feel your baby’s head pushing down and causing pelvic pressure. °· Your baby is not moving inside you as much as it used to. °Summary °· Contractions that occur before labor are called Braxton Hicks contractions, false labor, or practice contractions. °· Braxton Hicks contractions are usually shorter, weaker, farther apart, and less regular than true labor contractions. True labor contractions usually become progressively stronger and regular and they become more frequent. °· Manage discomfort from Braxton Hicks contractions by changing position, resting in a warm bath, drinking plenty of water, or practicing deep breathing. °This information is not intended to replace advice given to you by your health care provider. Make sure you discuss any questions you have with your health care provider. °Document Released: 11/10/2005 Document Revised: 09/29/2016 Document Reviewed: 09/29/2016 °Elsevier Interactive Patient Education © 2017 [employer] ° °

## 2017-08-04 ENCOUNTER — Ambulatory Visit (INDEPENDENT_AMBULATORY_CARE_PROVIDER_SITE_OTHER): Payer: Medicaid Other | Admitting: Obstetrics and Gynecology

## 2017-08-04 VITALS — BP 108/82 | HR 111 | Wt 140.7 lb

## 2017-08-04 DIAGNOSIS — Z3493 Encounter for supervision of normal pregnancy, unspecified, third trimester: Secondary | ICD-10-CM

## 2017-08-04 DIAGNOSIS — Z6791 Unspecified blood type, Rh negative: Secondary | ICD-10-CM

## 2017-08-04 DIAGNOSIS — O26899 Other specified pregnancy related conditions, unspecified trimester: Secondary | ICD-10-CM

## 2017-08-04 DIAGNOSIS — Z3483 Encounter for supervision of other normal pregnancy, third trimester: Secondary | ICD-10-CM

## 2017-08-04 NOTE — Progress Notes (Signed)
   PRENATAL VISIT NOTE  Subjective:  Cindy Ware is a 21 y.o. G3P1011 at 512w6d being seen today for ongoing prenatal care.  She is currently monitored for the following issues for this low-risk pregnancy and has Supervision of low-risk pregnancy and Rh negative state in antepartum period on her problem list.  Patient reports no complaints.  Contractions: Irregular. Vag. Bleeding: None.  Movement: Present. Denies leaking of fluid.   The following portions of the patient's history were reviewed and updated as appropriate: allergies, current medications, past family history, past medical history, past social history, past surgical history and problem list. Problem list updated.  Objective:   Vitals:   08/04/17 1308  BP: 108/82  Pulse: (!) 111  Weight: 63.8 kg (140 lb 11.2 oz)    Fetal Status: Fetal Heart Rate (bpm): 143   Movement: Present     General:  Alert, oriented and cooperative. Patient is in no acute distress.  Skin: Skin is warm and dry. No rash noted.   Cardiovascular: Normal heart rate noted  Respiratory: Normal respiratory effort, no problems with respiration noted  Abdomen: Soft, gravid, appropriate for gestational age.  Pain/Pressure: Present     Pelvic: Cervical exam deferred        Extremities: Normal range of motion.  Edema: None  Mental Status:  Normal mood and affect. Normal behavior. Normal judgment and thought content.   Assessment and Plan:  Pregnancy: G3P1011 at 382w6d  1. Encounter for supervision of low-risk pregnancy in third trimester  2. Rh negative state in antepartum period  Received rhogam at 28 weeks.    Term labor symptoms and general obstetric precautions including but not limited to vaginal bleeding, contractions, leaking of fluid and fetal movement were reviewed in detail with the patient. Please refer to After Visit Summary for other counseling recommendations.  No Follow-up on file.   Venia CarbonJennifer Rasch, NP

## 2017-08-06 ENCOUNTER — Telehealth: Payer: Self-pay | Admitting: *Deleted

## 2017-08-06 NOTE — Telephone Encounter (Signed)
Cindy Ware called this am and had a question about a tea she saw in the grocery store that is caffeine free and wants to know if she can drink it and is ok with her baby.  I called Cindy Ware and we discussed she is 39 weeks and she states is called sleep tea . We discussed  is ok to drink some caffeine free tea since it still may have some caffeine,but only 1or 2 cups a day.

## 2017-08-09 ENCOUNTER — Inpatient Hospital Stay (HOSPITAL_COMMUNITY)
Admission: AD | Admit: 2017-08-09 | Discharge: 2017-08-11 | DRG: 775 | Disposition: A | Discharge status: Home / Self Care | Payer: Medicaid Other | Source: Ambulatory Visit | Attending: Obstetrics and Gynecology | Admitting: Obstetrics and Gynecology

## 2017-08-09 ENCOUNTER — Encounter (HOSPITAL_COMMUNITY): Payer: Self-pay

## 2017-08-09 DIAGNOSIS — B951 Streptococcus, group B, as the cause of diseases classified elsewhere: Secondary | ICD-10-CM

## 2017-08-09 DIAGNOSIS — O99824 Streptococcus B carrier state complicating childbirth: Secondary | ICD-10-CM | POA: Diagnosis present

## 2017-08-09 DIAGNOSIS — Z6791 Unspecified blood type, Rh negative: Secondary | ICD-10-CM | POA: Diagnosis not present

## 2017-08-09 DIAGNOSIS — O26893 Other specified pregnancy related conditions, third trimester: Secondary | ICD-10-CM | POA: Diagnosis present

## 2017-08-09 DIAGNOSIS — Z3A39 39 weeks gestation of pregnancy: Secondary | ICD-10-CM | POA: Diagnosis not present

## 2017-08-09 DIAGNOSIS — O26899 Other specified pregnancy related conditions, unspecified trimester: Secondary | ICD-10-CM

## 2017-08-09 DIAGNOSIS — Z3493 Encounter for supervision of normal pregnancy, unspecified, third trimester: Secondary | ICD-10-CM | POA: Diagnosis present

## 2017-08-09 LAB — CBC
HEMATOCRIT: 35.3 % — AB (ref 36.0–46.0)
HEMOGLOBIN: 12.2 g/dL (ref 12.0–15.0)
MCH: 31.9 pg (ref 26.0–34.0)
MCHC: 34.6 g/dL (ref 30.0–36.0)
MCV: 92.2 fL (ref 78.0–100.0)
Platelets: 176 10*3/uL (ref 150–400)
RBC: 3.83 MIL/uL — ABNORMAL LOW (ref 3.87–5.11)
RDW: 13.9 % (ref 11.5–15.5)
WBC: 10.8 10*3/uL — AB (ref 4.0–10.5)

## 2017-08-09 LAB — TYPE AND SCREEN
ABO/RH(D): A NEG
ANTIBODY SCREEN: NEGATIVE

## 2017-08-09 LAB — RPR: RPR Ser Ql: NONREACTIVE

## 2017-08-09 MED ORDER — ONDANSETRON HCL 4 MG PO TABS: 4.0000 mg | ORAL_TABLET | ORAL | Status: DC | PRN

## 2017-08-09 MED ORDER — TETANUS-DIPHTH-ACELL PERTUSSIS 5-2.5-18.5 LF-MCG/0.5 IM SUSP: 0.5000 mL | Freq: Once | INTRAMUSCULAR | Status: DC

## 2017-08-09 MED ORDER — LIDOCAINE HCL (PF) 1 % IJ SOLN
30.0000 mL | INTRAMUSCULAR | Status: DC | PRN
  Filled 2017-08-09: qty 30

## 2017-08-09 MED ORDER — COCONUT OIL OIL: 1.0000 "application " | TOPICAL_OIL | Status: DC | PRN

## 2017-08-09 MED ORDER — ONDANSETRON HCL 4 MG/2ML IJ SOLN: 4.0000 mg | INTRAMUSCULAR | Status: DC | PRN

## 2017-08-09 MED ORDER — INFLUENZA VAC SPLIT QUAD 0.5 ML IM SUSY
0.5000 mL | PREFILLED_SYRINGE | INTRAMUSCULAR | Status: AC
  Administered 2017-08-10: 0.5 mL via INTRAMUSCULAR
  Filled 2017-08-09: qty 0.5

## 2017-08-09 MED ORDER — DIPHENHYDRAMINE HCL 25 MG PO CAPS: 25.0000 mg | ORAL_CAPSULE | Freq: Four times a day (QID) | ORAL | Status: DC | PRN

## 2017-08-09 MED ORDER — LACTATED RINGERS IV SOLN: 500.0000 mL | INTRAVENOUS | Status: DC | PRN

## 2017-08-09 MED ORDER — PRENATAL MULTIVITAMIN CH
1.0000 | ORAL_TABLET | Freq: Every day | ORAL | Status: DC
  Administered 2017-08-09 – 2017-08-11 (×3): 1 via ORAL
  Filled 2017-08-09 (×3): qty 1

## 2017-08-09 MED ORDER — SENNOSIDES-DOCUSATE SODIUM 8.6-50 MG PO TABS
2.0000 | ORAL_TABLET | ORAL | Status: DC
  Administered 2017-08-10 (×2): 2 via ORAL
  Filled 2017-08-09 (×2): qty 2

## 2017-08-09 MED ORDER — ACETAMINOPHEN 325 MG PO TABS: 650.0000 mg | ORAL_TABLET | ORAL | Status: DC | PRN

## 2017-08-09 MED ORDER — LIDOCAINE HCL (PF) 1 % IJ SOLN
INTRAMUSCULAR | Status: AC
  Filled 2017-08-09: qty 30

## 2017-08-09 MED ORDER — WITCH HAZEL-GLYCERIN EX PADS: 1.0000 "application " | MEDICATED_PAD | CUTANEOUS | Status: DC | PRN

## 2017-08-09 MED ORDER — OXYCODONE-ACETAMINOPHEN 5-325 MG PO TABS: 1.0000 | ORAL_TABLET | ORAL | Status: DC | PRN

## 2017-08-09 MED ORDER — OXYTOCIN 40 UNITS IN LACTATED RINGERS INFUSION - SIMPLE MED: 2.5000 [IU]/h | INTRAVENOUS | Status: DC

## 2017-08-09 MED ORDER — ZOLPIDEM TARTRATE 5 MG PO TABS
5.0000 mg | ORAL_TABLET | Freq: Every evening | ORAL | Status: DC | PRN
Start: 2017-08-09 — End: 2017-08-11

## 2017-08-09 MED ORDER — IBUPROFEN 600 MG PO TABS
600.0000 mg | ORAL_TABLET | Freq: Four times a day (QID) | ORAL | Status: DC
  Administered 2017-08-09 – 2017-08-11 (×10): 600 mg via ORAL
  Filled 2017-08-09 (×10): qty 1

## 2017-08-09 MED ORDER — DIBUCAINE 1 % RE OINT: 1.0000 "application " | TOPICAL_OINTMENT | RECTAL | Status: DC | PRN

## 2017-08-09 MED ORDER — SOD CITRATE-CITRIC ACID 500-334 MG/5ML PO SOLN: 30.0000 mL | ORAL | Status: DC | PRN

## 2017-08-09 MED ORDER — ONDANSETRON HCL 4 MG/2ML IJ SOLN: 4.0000 mg | Freq: Four times a day (QID) | INTRAMUSCULAR | Status: DC | PRN

## 2017-08-09 MED ORDER — OXYTOCIN BOLUS FROM INFUSION
500.0000 mL | Freq: Once | INTRAVENOUS | Status: AC
  Administered 2017-08-09: 500 mL via INTRAVENOUS

## 2017-08-09 MED ORDER — LACTATED RINGERS IV SOLN: INTRAVENOUS | Status: DC

## 2017-08-09 MED ORDER — SIMETHICONE 80 MG PO CHEW: 80.0000 mg | CHEWABLE_TABLET | ORAL | Status: DC | PRN

## 2017-08-09 MED ORDER — OXYTOCIN 40 UNITS IN LACTATED RINGERS INFUSION - SIMPLE MED
INTRAVENOUS | Status: AC
  Administered 2017-08-09: 500 mL via INTRAVENOUS
  Filled 2017-08-09: qty 1000

## 2017-08-09 MED ORDER — SODIUM CHLORIDE 0.9 % IV SOLN
2.0000 g | Freq: Once | INTRAVENOUS | Status: DC
  Filled 2017-08-09: qty 2000

## 2017-08-09 MED ORDER — BENZOCAINE-MENTHOL 20-0.5 % EX AERO
1.0000 "application " | INHALATION_SPRAY | CUTANEOUS | Status: DC | PRN
  Administered 2017-08-09: 1 via TOPICAL
  Filled 2017-08-09: qty 56

## 2017-08-09 MED ORDER — ACETAMINOPHEN 325 MG PO TABS
650.0000 mg | ORAL_TABLET | ORAL | Status: DC | PRN
  Administered 2017-08-10: 650 mg via ORAL
  Filled 2017-08-09: qty 2

## 2017-08-09 MED ORDER — OXYCODONE-ACETAMINOPHEN 5-325 MG PO TABS: 2.0000 | ORAL_TABLET | ORAL | Status: DC | PRN

## 2017-08-09 NOTE — H&P (Signed)
OBSTETRIC ADMISSION HISTORY AND PHYSICAL  Cindy Ware is a 21 y.o. female G3P1011 with IUP at [redacted]w[redacted]d by LMP presenting for SOL. She reports contractions began earlier this morning and were occurring every three minutes.  She progressed from 4 to 7 cm in MAU and had SROM.  She reports +FMs, no VB, no blurry vision, headaches or peripheral edema, and no RUQ pain.  She plans on bottle feeding. She request OCPs for birth control. She received her prenatal care at [facility]   Dating: By LMP --->  Estimated Date of Delivery: 08/12/17  Sono:    , CWD, normal anatomy, cephalic presentation, 623g, 62% EFW   Prenatal History/Complications: Low-risk pregnancy Clinic [facility] Prenatal Labs  Dating LMP  Blood type: A/Negative/-- (03/08 1336)   Genetic Screen 1 Screen: nl NT   AFP:     Quad:     NIPS: Antibody:Negative (03/08 1336)  Anatomic [place]  normal Rubella: 1.97 (03/08 1336)Immune  GTT Early:               Third trimester: normal 2 hr RPR: Non Reactive (03/08 1336)   Flu vaccine 01/29/17 HBsAg: Negative (03/08 1336)   TDaP vaccine  05/19/2017                        Rhogam: 05/19/2017 HIV: Non Reactive (10/15 2232)   Baby Food Formula                                              GBS: POSITIVE  Contraception OCP Pap: negative (01/29/2017)  Circumcision  yes   Pediatrician Family Practice   Support Person Mom Marylene Land     Past Medical History: Past Medical History:  Diagnosis Date  . Medical history non-contributory     Past Surgical History: Past Surgical History:  Procedure Laterality Date  . NO PAST SURGERIES    . THERAPEUTIC ABORTION      Obstetrical History: OB History    Gravida Para Term Preterm AB Living   0 1 1   SAB TAB Ectopic Multiple Live Births   0 1 0 0 1      Social History: Social History   Social History  . Marital status: Single    Spouse name: N/A  . Number of children: N/A  . Years of education: N/A   Social History Main Topics  . Smoking status:  Never Smoker  . Smokeless tobacco: Never Used  . Alcohol use No     Comment: before pregnancy   . Drug use: No     Comment: no use since pregnancy  . Sexual activity: Yes    Birth control/ protection: None   Other Topics Concern  . None   Social History Narrative  . None    Family History: Family History  Problem Relation Age of Onset  . Hypertension Mother   . Asthma Maternal Uncle     Allergies: No Known Allergies  Prescriptions Prior to Admission  Medication Sig Dispense Refill Last Dose  . Prenatal Vit-Fe Fumarate-FA (MULTIVITAMIN-PRENATAL) 27-0.8 MG TABS tablet Take 1 tablet by mouth daily at 12 noon.   08/08/2017 at Unknown time    Review of Systems  All systems reviewed and negative except as stated in HPI  Blood pressure 114/66, pulse (!) 109, temperature 98.1 F (36.7 C),  temperature source Oral, resp. rate 17, height  (1.6 m), weight 65.8 kg (145 lb), last menstrual period 11/05/2016, SpO2 98 %. General appearance: alert and cooperative Lungs: clear to auscultation bilaterally Heart: regular rate and rhythm Abdomen: soft, non-tender; bowel sounds normal Pelvic: deferred Extremities: Homans sign is negative, no sign of DVT Presentation: cephalic Fetal monitoringBaseline: 140 bpm, Variability: Good {> 6 bpm), Accelerations: Reactive and Decelerations: Absent Uterine activity: q3-4 min Dilation: 4 Effacement (%): 80 Station: -1 Exam by:: Camelia Eng RN  Prenatal labs: ABO, Rh: A/Negative/-- (03/08 1336) Antibody: Negative (03/08 1336) Rubella: 1.97 (03/08 1336) RPR: Non Reactive (06/26 0918)  HBsAg: Negative (03/08 1336)  HIV:   Nonreactive 6/26 GBS:   Positive 2 hr Glucola WNL Genetic screening  NT normal Anatomy US normal female  Prenatal Transfer Tool  Maternal Diabetes: No Genetic Screening: Normal Maternal Ultrasounds/Referrals: Normal Fetal Ultrasounds or other Referrals:  None Maternal Substance Abuse:  No Significant Maternal  Medications:  None Significant Maternal Lab Results: Lab values include: Group B Strep positive  No results found for this or any previous visit (from the past 24 hour(s)).  Patient Active Problem List   Diagnosis Date Noted  . Rh negative state in antepartum period 02/19/2017  . Supervision of low-risk pregnancy 01/29/2017    Assessment/Plan:  Cindy Ware is a 21 y.o. G3P1011 at [redacted]w[redacted]d here for SOL.   #Labor: Progressed to complete on arrival to MAU. Expectant management. Anticipate SVD.  #ID:  GBS positive -will not get adequate treatment due to precipitous delivery #MOF: Bottle #MOC: OCPs #Circ: Desired  Larene Beach, DO PGY-2 Family Medicine Resident 08/09/2017, 2:04 AM   OB FELLOW HISTORY AND PHYSICAL ATTESTATION  I confirm that I have verified the information documented in the resident's note and that I have also personally reperformed the physical exam and all medical decision making activities. I agree with above documentation and have made edits as needed.   Caryl Ada OB Fellow

## 2017-08-09 NOTE — MAU Note (Signed)
Pt reports contractions every 3 mins for several hours. Denies LOF or vaginal bleeding. Reports good fetal movement. States cervix has not been examined recently.

## 2017-08-10 MED ORDER — RHO D IMMUNE GLOBULIN 1500 UNIT/2ML IJ SOSY
300.0000 ug | PREFILLED_SYRINGE | Freq: Once | INTRAMUSCULAR | Status: AC
  Administered 2017-08-10: 300 ug via INTRAVENOUS
  Filled 2017-08-10: qty 2

## 2017-08-10 NOTE — Progress Notes (Signed)
Post Partum Day 1 Subjective: [id number] s/p SVD without complications.  Doing well.  Ambulating with ease.  Lochia minimal.  Pain controlled.  Tolerating PO.  No concerns  Objective: Blood pressure (!) 100/59, pulse 79, temperature 98.4 F (36.9 C), temperature source Oral, resp. rate 18, height  (1.6 m), weight 65.8 kg (145 lb), last menstrual period 11/05/2016, SpO2 98 %, unknown if currently breastfeeding.  Physical Exam:  General: alert and cooperative Lochia: appropriate Uterine Fundus: firm Incision: N/A DVT Evaluation: No evidence of DVT seen on physical exam.   Recent Labs  08/09/17 0200  HGB 12.2  HCT 35.3*    Assessment/Plan: [id number] PPD#1 s/p SVD without complication.  Continue routine postpartum care. Bottle feeding.  Desires OCPs for contraception.  GBS positive status without treatment intrapartum - monitor infant x48 hours.   Anticipate discharge tomorrow.   LOS: 1 day   Larene Beach 08/10/2017, 7:33 AM

## 2017-08-10 NOTE — Progress Notes (Signed)
UR chart review completed.  

## 2017-08-11 LAB — RH IG WORKUP (INCLUDES ABO/RH)
ABO/RH(D): A NEG
FETAL SCREEN: NEGATIVE
GESTATIONAL AGE(WKS): 39.4
Unit division: 0

## 2017-08-11 MED ORDER — IBUPROFEN 600 MG PO TABS: 600.0000 mg | ORAL_TABLET | Freq: Four times a day (QID) | ORAL | 0 refills | Status: DC | PRN

## 2017-08-11 NOTE — Discharge Instructions (Signed)

## 2017-08-11 NOTE — Discharge Summary (Signed)
OB Discharge Summary     Patient Name: Cindy Ware DOB: 1995-12-22 MRN: [id number]  Date of admission: 08/09/2017 Delivering MD: KEY, Corrie Dandy K   Date of discharge: 08/11/2017  Admitting diagnosis: 39 WKS CTX EVERY 2 TO 3 MIN Intrauterine pregnancy: [redacted]w[redacted]d     Secondary diagnosis:  Active Problems:   Rh negative state in antepartum period   Spontaneous vaginal delivery   Positive GBS test   Normal labor  Additional problems:  Patient Active Problem List   Diagnosis Date Noted  . Spontaneous vaginal delivery 08/09/2017  . Positive GBS test 08/09/2017  . Normal labor 08/09/2017  . Rh negative state in antepartum period 02/19/2017  . Supervision of low-risk pregnancy 01/29/2017        Discharge diagnosis: Term Pregnancy Delivered                                                                                                Post partum procedures:rhogam  Augmentation: N/A  Complications: None  Hospital course:  Onset of Labor With Vaginal Delivery     21 y.o. yo [id number] at [redacted]w[redacted]d was admitted in Active Labor on 08/09/2017. Patient had an uncomplicated labor course as follows:  Membrane Rupture Time/Date: 1:50 AM ,08/09/2017   Intrapartum Procedures: Episiotomy: None [1]                                         Lacerations:  1st degree [2]  Patient had a delivery of a Viable infant. 08/09/2017  Information for the patient's newborn:  Lirio, Bach [[id number]]  Delivery Method: Vaginal, Spontaneous Delivery (Filed from Delivery Summary)    Pateint had an uncomplicated postpartum course.  She is ambulating, tolerating a regular diet, passing flatus, and urinating well. Patient is discharged home in stable condition on 08/11/17.   Physical exam  Vitals:   08/09/17 1743 08/10/17 0605 08/10/17 1813 08/11/17 0519  BP: 100/77 (!) 100/59 105/61 105/64  Pulse: 91 79 78 80  Resp: Temp:  98.4 F (36.9 C) 97.6 F (36.4 C) 98 F (36.7 C)  TempSrc:   Oral Oral Oral  SpO2:      Weight:      Height:       General: alert, cooperative and no distress Lochia: appropriate Uterine Fundus: firm Incision: N/A DVT Evaluation: No evidence of DVT seen on physical exam. Labs: Lab Results  Component Value Date   WBC 10.8 (H) 08/09/2017   HGB 12.2 08/09/2017   HCT 35.3 (L) 08/09/2017   MCV 92.2 08/09/2017   PLT 176 08/09/2017   CMP Latest Ref Rng & Units 09/07/2016  Glucose 65 - 99 mg/dL 102(V)  BUN 6 - 20 mg/dL 11  Creatinine 2.53 - 6.64 mg/dL 4.03  Sodium 474 - 259 mmol/L 139  Potassium 3.5 - 5.1 mmol/L 3.5  Chloride 101 - 111 mmol/L 102    Discharge instruction: per After Visit Summary and "Baby and Me Booklet".  After visit meds:  Allergies as of 08/11/2017   No Known Allergies     Medication List    TAKE these medications   acetaminophen 500 MG tablet Commonly known as:  TYLENOL Take 1,000 mg by mouth every 6 (six) hours as needed for headache.   ibuprofen 600 MG tablet Commonly known as:  ADVIL,MOTRIN Take 1 tablet (600 mg total) by mouth every 6 (six) hours as needed for moderate pain or cramping.   prenatal multivitamin Tabs tablet Take 1 tablet by mouth daily at 12 noon.            Discharge Care Instructions        Start     Ordered   08/11/17 0000  ibuprofen (ADVIL,MOTRIN) 600 MG tablet  Every 6 hours PRN     08/11/17 0750      Diet: routine diet  Activity: Advance as tolerated. Pelvic rest for 6 weeks.   Outpatient follow up:6 weeks Follow up Appt:Future Appointments Date Time Provider Department Center  08/12/2017 2:20 PM Pincus Large, DO [facility]   Follow up Visit:No Follow-up on file.  Postpartum contraception: Combination OCPs  Newborn Data: Live born female  Birth Weight: 7 lb 11.3 oz (3495 g) APGAR: 9, 9  Baby Feeding: Bottle Disposition:home with mother   08/11/2017 Larene Beach, DO PGY-2 Family Medicine Resident  Midwife attestation I have seen and examined this  patient and agree with above documentation in the resident's note.   Cindy Ware is a 21 y.o. [id number] s/p SVD.   Pain is well controlled.  Plan for birth control is oral contraceptives (estrogen/progesterone).  Method of Feeding: bottle  PE:  Gen: well appearing Heart: reg rate Lungs: normal WOB Fundus firm Ext: soft, no pain, no edema   Recent Labs  08/09/17 0200  HGB 12.2  HCT 35.3*     Assessment - discharge today  Plan: - postpartum care discussed - f/u clinic in 6 weeks for postpartum visit   Donette Larry, CNM 8:57 AM

## 2017-08-12 ENCOUNTER — Encounter: Payer: Medicaid Other | Admitting: Obstetrics and Gynecology

## 2017-08-17 ENCOUNTER — Encounter: Payer: Self-pay | Admitting: General Practice

## 2017-09-15 ENCOUNTER — Ambulatory Visit: Payer: Medicaid Other | Admitting: Medical

## 2017-09-28 ENCOUNTER — Other Ambulatory Visit (HOSPITAL_COMMUNITY)
Admission: RE | Admit: 2017-09-28 | Discharge: 2017-09-28 | Disposition: A | Discharge status: Home / Self Care | Payer: Medicaid Other | Source: Ambulatory Visit | Attending: Obstetrics and Gynecology | Admitting: Obstetrics and Gynecology

## 2017-09-28 ENCOUNTER — Ambulatory Visit (INDEPENDENT_AMBULATORY_CARE_PROVIDER_SITE_OTHER): Payer: Medicaid Other | Admitting: Obstetrics and Gynecology

## 2017-09-28 ENCOUNTER — Encounter: Payer: Self-pay | Admitting: Obstetrics and Gynecology

## 2017-09-28 VITALS — BP 114/80 | HR 74 | Ht 63.0 in | Wt 120.0 lb

## 2017-09-28 DIAGNOSIS — Z309 Encounter for contraceptive management, unspecified: Secondary | ICD-10-CM | POA: Diagnosis not present

## 2017-09-28 DIAGNOSIS — N898 Other specified noninflammatory disorders of vagina: Secondary | ICD-10-CM

## 2017-09-28 DIAGNOSIS — Z3042 Encounter for surveillance of injectable contraceptive: Secondary | ICD-10-CM

## 2017-09-28 DIAGNOSIS — N760 Acute vaginitis: Secondary | ICD-10-CM | POA: Diagnosis not present

## 2017-09-28 DIAGNOSIS — B9689 Other specified bacterial agents as the cause of diseases classified elsewhere: Secondary | ICD-10-CM | POA: Insufficient documentation

## 2017-09-28 LAB — POCT PREGNANCY, URINE: PREG TEST UR: NEGATIVE

## 2017-09-28 MED ORDER — MEDROXYPROGESTERONE ACETATE 150 MG/ML IM SUSP
150.0000 mg | Freq: Once | INTRAMUSCULAR | Status: AC
  Administered 2017-09-28: 150 mg via INTRAMUSCULAR

## 2017-09-28 NOTE — Progress Notes (Signed)
Subjective:     Cindy Ware is a 21 y.o. female who presents for a postpartum visit. She is 6 weeks postpartum following a spontaneous vaginal delivery. I have fully reviewed the prenatal and intrapartum course. The delivery was at 39 gestational weeks. Outcome: spontaneous vaginal delivery. Anesthesia: none. Postpartum course has been uncomplicated except for some discharge and itching. Baby's course has been without issue, states she is doing well and FOB has stepped up and been very supportive. Baby is feeding by Octavia HeirGerber Soothe. Bleeding no bleeding. Bowel function is normal. Bladder function is normal. Patient is not sexually active. Contraception method is Depo-Provera injections. Postpartum depression screening: negative.  The following portions of the patient's history were reviewed and updated as appropriate: allergies, current medications, past family history, past medical history, past social history, past surgical history and problem list.  Review of Systems Pertinent items are noted in HPI.   Objective:    BP 114/80   Pulse 74   Ht 5\' 3"  (1.6 m)   Wt 120 lb (54.4 kg)   LMP 09/22/2017 (Exact Date)   BMI 21.26 kg/m   General:  alert, cooperative and appears stated age   Breasts:  deferred  Lungs: Normal effort of breathing  Heart:  Regular rate  Abdomen: soft, non-tender   Vulva:  normal appearing external female genitalia, scant white discharge at introitus    Assessment:    Normal postpartum exam, with some discharge, wet prep sent. No issues, good support with family, baby doing well, bottle feeding.  Plan:    1. Contraception: Depo-Provera injections, patient unsure about what she wants to do long term but is happy to continue depo for now, will think about it and call if she desires other form of birth control 2. Pap - due 01/2020 3. Follow up in: for annual or as needed   K. Therese SarahMeryl Eliyas Suddreth, M.D. Attending Obstetrician & Gynecologist, [facility]  for [facility], [facility]

## 2017-09-29 LAB — CERVICOVAGINAL ANCILLARY ONLY
BACTERIAL VAGINITIS: POSITIVE — AB
CANDIDA VAGINITIS: NEGATIVE
Chlamydia: NEGATIVE
NEISSERIA GONORRHEA: NEGATIVE
Trichomonas: NEGATIVE

## 2017-09-30 ENCOUNTER — Other Ambulatory Visit: Payer: Self-pay | Admitting: Obstetrics and Gynecology

## 2017-09-30 DIAGNOSIS — B9689 Other specified bacterial agents as the cause of diseases classified elsewhere: Secondary | ICD-10-CM

## 2017-09-30 DIAGNOSIS — N760 Acute vaginitis: Principal | ICD-10-CM

## 2017-09-30 MED ORDER — METRONIDAZOLE 500 MG PO TABS: 500.0000 mg | ORAL_TABLET | Freq: Two times a day (BID) | ORAL | 0 refills | Status: DC

## 2017-09-30 NOTE — Progress Notes (Signed)
Script sent to pharmcy for BV, patient not breastfeeding at pp visit.

## 2017-10-06 ENCOUNTER — Telehealth: Payer: Self-pay | Admitting: General Practice

## 2017-10-06 NOTE — Telephone Encounter (Signed)
Patient called into front office requesting call back with results. Called patient & informed her of BV and medication sent to pharmacy. Also discussed not to drink alcohol while on that medication. Patient verbalized understanding and asked if she could continue taking her probiotics with the medicine. Told patient yes that is fine. Patient verbalized understanding & had no questions

## 2017-10-29 ENCOUNTER — Encounter (HOSPITAL_BASED_OUTPATIENT_CLINIC_OR_DEPARTMENT_OTHER): Payer: Self-pay

## 2017-10-29 ENCOUNTER — Emergency Department (HOSPITAL_BASED_OUTPATIENT_CLINIC_OR_DEPARTMENT_OTHER)
Admission: EM | Admit: 2017-10-29 | Discharge: 2017-10-29 | Disposition: A | Discharge status: Home / Self Care | Payer: Medicaid Other | Attending: Emergency Medicine | Admitting: Emergency Medicine

## 2017-10-29 ENCOUNTER — Emergency Department (HOSPITAL_BASED_OUTPATIENT_CLINIC_OR_DEPARTMENT_OTHER): Payer: Medicaid Other

## 2017-10-29 ENCOUNTER — Other Ambulatory Visit: Payer: Self-pay

## 2017-10-29 DIAGNOSIS — N12 Tubulo-interstitial nephritis, not specified as acute or chronic: Secondary | ICD-10-CM | POA: Insufficient documentation

## 2017-10-29 DIAGNOSIS — M545 Low back pain: Secondary | ICD-10-CM | POA: Diagnosis not present

## 2017-10-29 DIAGNOSIS — R350 Frequency of micturition: Secondary | ICD-10-CM | POA: Insufficient documentation

## 2017-10-29 DIAGNOSIS — R300 Dysuria: Secondary | ICD-10-CM | POA: Diagnosis present

## 2017-10-29 DIAGNOSIS — E876 Hypokalemia: Secondary | ICD-10-CM | POA: Insufficient documentation

## 2017-10-29 DIAGNOSIS — J90 Pleural effusion, not elsewhere classified: Secondary | ICD-10-CM | POA: Diagnosis not present

## 2017-10-29 DIAGNOSIS — R110 Nausea: Secondary | ICD-10-CM | POA: Insufficient documentation

## 2017-10-29 DIAGNOSIS — R1084 Generalized abdominal pain: Secondary | ICD-10-CM | POA: Insufficient documentation

## 2017-10-29 LAB — PREGNANCY, URINE: Preg Test, Ur: NEGATIVE

## 2017-10-29 LAB — COMPREHENSIVE METABOLIC PANEL
ALT: 17 U/L (ref 14–54)
ANION GAP: 9 (ref 5–15)
AST: 15 U/L (ref 15–41)
Albumin: 3.6 g/dL (ref 3.5–5.0)
Alkaline Phosphatase: 67 U/L (ref 38–126)
BUN: 8 mg/dL (ref 6–20)
CO2: 23 mmol/L (ref 22–32)
CREATININE: 0.66 mg/dL (ref 0.44–1.00)
Calcium: 8.5 mg/dL — ABNORMAL LOW (ref 8.9–10.3)
Chloride: 101 mmol/L (ref 101–111)
GLUCOSE: 112 mg/dL — AB (ref 65–99)
Potassium: 2.5 mmol/L — CL (ref 3.5–5.1)
Sodium: 133 mmol/L — ABNORMAL LOW (ref 135–145)
TOTAL PROTEIN: 7.6 g/dL (ref 6.5–8.1)
Total Bilirubin: 2.2 mg/dL — ABNORMAL HIGH (ref 0.3–1.2)

## 2017-10-29 LAB — URINALYSIS, MICROSCOPIC (REFLEX)

## 2017-10-29 LAB — CBC
HCT: 31.2 % — ABNORMAL LOW (ref 36.0–46.0)
Hemoglobin: 10.4 g/dL — ABNORMAL LOW (ref 12.0–15.0)
MCH: 30.5 pg (ref 26.0–34.0)
MCHC: 33.3 g/dL (ref 30.0–36.0)
MCV: 91.5 fL (ref 78.0–100.0)
PLATELETS: 182 10*3/uL (ref 150–400)
RBC: 3.41 MIL/uL — ABNORMAL LOW (ref 3.87–5.11)
RDW: 13.3 % (ref 11.5–15.5)
WBC: 10.4 10*3/uL (ref 4.0–10.5)

## 2017-10-29 LAB — URINALYSIS, ROUTINE W REFLEX MICROSCOPIC
BILIRUBIN URINE: NEGATIVE
Glucose, UA: NEGATIVE mg/dL
Ketones, ur: 15 mg/dL — AB
Nitrite: POSITIVE — AB
PH: 6 (ref 5.0–8.0)
Protein, ur: 100 mg/dL — AB
SPECIFIC GRAVITY, URINE: 1.02 (ref 1.005–1.030)

## 2017-10-29 MED ORDER — ONDANSETRON HCL 4 MG/2ML IJ SOLN
4.0000 mg | Freq: Once | INTRAMUSCULAR | Status: AC
  Administered 2017-10-29: 4 mg via INTRAVENOUS
  Filled 2017-10-29: qty 2

## 2017-10-29 MED ORDER — ACETAMINOPHEN 325 MG PO TABS
650.0000 mg | ORAL_TABLET | Freq: Once | ORAL | Status: AC
  Administered 2017-10-29: 650 mg via ORAL
  Filled 2017-10-29: qty 2

## 2017-10-29 MED ORDER — SODIUM CHLORIDE 0.9 % IV BOLUS (SEPSIS)
1000.0000 mL | Freq: Once | INTRAVENOUS | Status: AC
  Administered 2017-10-29: 1000 mL via INTRAVENOUS

## 2017-10-29 MED ORDER — CEPHALEXIN 500 MG PO CAPS: 500.0000 mg | ORAL_CAPSULE | Freq: Four times a day (QID) | ORAL | 0 refills | Status: AC

## 2017-10-29 MED ORDER — MORPHINE SULFATE (PF) 4 MG/ML IV SOLN
4.0000 mg | Freq: Once | INTRAVENOUS | Status: AC
  Administered 2017-10-29: 4 mg via INTRAVENOUS
  Filled 2017-10-29: qty 1

## 2017-10-29 MED ORDER — ONDANSETRON HCL 4 MG PO TABS: 4.0000 mg | ORAL_TABLET | Freq: Three times a day (TID) | ORAL | 0 refills | Status: DC | PRN

## 2017-10-29 MED ORDER — POTASSIUM CHLORIDE 10 MEQ/100ML IV SOLN
10.0000 meq | Freq: Once | INTRAVENOUS | Status: AC
  Administered 2017-10-29: 10 meq via INTRAVENOUS
  Filled 2017-10-29: qty 100

## 2017-10-29 MED ORDER — POTASSIUM CHLORIDE ER 10 MEQ PO TBCR: 10.0000 meq | EXTENDED_RELEASE_TABLET | Freq: Every day | ORAL | 0 refills | Status: DC

## 2017-10-29 MED ORDER — KETOROLAC TROMETHAMINE 15 MG/ML IJ SOLN
15.0000 mg | Freq: Once | INTRAMUSCULAR | Status: AC
  Administered 2017-10-29: 15 mg via INTRAVENOUS
  Filled 2017-10-29: qty 1

## 2017-10-29 MED ORDER — DEXTROSE 5 % IV SOLN
1.0000 g | Freq: Once | INTRAVENOUS | Status: AC
  Administered 2017-10-29: 1 g via INTRAVENOUS
  Filled 2017-10-29: qty 10

## 2017-10-29 NOTE — ED Triage Notes (Signed)
C/o urinary fre, lower back pain, fever x 2 days-NAD-steady gait

## 2017-10-29 NOTE — Discharge Instructions (Addendum)
Take antibiotics as prescribed.  Take the entire course of antibiotics, even if your symptoms have improved. Take potassium pills daily for the next 7 days. Use Zofran as needed for nausea or vomiting. Take Tylenol or ibuprofen as needed for pain or fevers. Follow-up with your OB/GYN to have a pelvic exam performed to make sure you do not have an infection. Follow-up with your primary care doctor for recheck of your potassium. I have given you the information for the heart doctors.  You should contact them for further evaluation of your heart. Return to the emergency room if you develop worsening fevers, worsening symptoms, chest pain, shortness of breath, or any new or concerning symptoms.

## 2017-10-29 NOTE — ED Notes (Signed)
Date and time results received: 10/29/17 1655 (use smartphrase ".now" to insert current time)  Test: K Critical Value: 2.5  Name of Provider Notified: Long  Orders Received? Or Actions Taken?: no new orders

## 2017-10-29 NOTE — ED Provider Notes (Signed)
[facility] EMERGENCY DEPARTMENT Provider Note   CSN: [id number] Arrival date & time: 10/29/17  1539     History   Chief Complaint Chief Complaint  Patient presents with  . Urinary Frequency    HPI Cindy Ware is a 21 y.o. female presenting with urinary sxs, fever, and back pain.   Pt states that for the past 2 days, she has been feeling poorly. She has urinary frequency and dysuria. She is unsure if she has hematuria, as she just finished her period. She is having fevers, chills, and nausea without vomiting. She has associated low back pain of the L side, worse with movements. The pain is intermittent and sharp. It is not associated with oral intake, urination, or BM she reports frequent history of UTI's due to dehydration and holding her urine. She has never had it this bad. She had a non complicated vaginal delivery 2 months ago, and is bottle feeding. She has had no post-partum complications. She denies sore thraot, cough, CP, SOB, or anterior abd pain. She denies history of kidney stones. She has no other medical problems, and takes only probiotics daily due to frequent yeast infections. She denies recent vaginal discharge. She denies fall, trauma, or injury. She has been taking tylenol without improvement of sxs, last dose was this morning.   HPI  Past Medical History:  Diagnosis Date  . Medical history non-contributory     Patient Active Problem List   Diagnosis Date Noted  . Spontaneous vaginal delivery 08/09/2017  . Supervision of low-risk pregnancy 01/29/2017    Past Surgical History:  Procedure Laterality Date  . NO PAST SURGERIES    . THERAPEUTIC ABORTION      OB History    Gravida Para Term Preterm AB Living   3 2 2  0 1 2   SAB TAB Ectopic Multiple Live Births   0 1 0 0 2       Home Medications    Prior to Admission medications   Medication Sig Start Date End Date Taking? Authorizing Provider  cephALEXin (KEFLEX) 500 MG capsule Take  1 capsule (500 mg total) by mouth 4 (four) times daily for 10 days. 10/29/17 11/08/17  Khalif Stender, PA-C  ondansetron (ZOFRAN) 4 MG tablet Take 1 tablet (4 mg total) by mouth every 8 (eight) hours as needed for nausea or vomiting. 10/29/17   Seth Friedlander, PA-C  potassium chloride (K-DUR) 10 MEQ tablet Take 1 tablet (10 mEq total) by mouth daily for 7 days. 10/29/17 11/05/17  Kathelene Rumberger, PA-C    Family History Family History  Problem Relation Age of Onset  . Hypertension Mother   . Asthma Maternal Uncle     Social History Social History   Tobacco Use  . Smoking status: Never Smoker  . Smokeless tobacco: Never Used  Substance Use Topics  . Alcohol use: No  . Drug use: No     Allergies   Patient has no known allergies.   Review of Systems Review of Systems  Constitutional: Positive for chills and fever.  Gastrointestinal: Positive for nausea.  Genitourinary: Positive for dysuria, flank pain and frequency.  All other systems reviewed and are negative.    Physical Exam Updated Vital Signs BP 115/71   Pulse 96   Temp 99.5 F (37.5 C)   Resp 13   Ht 5\' 3"  (1.6 m)   Wt 53.1 kg (117 lb)   LMP 10/23/2017   SpO2 99%   BMI 20.73 kg/m  Physical Exam  Constitutional: She is oriented to person, place, and time. She appears well-developed and well-nourished. No distress.  HENT:  Head: Normocephalic and atraumatic.  Eyes: Conjunctivae and EOM are normal. Pupils are equal, round, and reactive to light.  Neck: Normal range of motion. Neck supple.  Cardiovascular: Regular rhythm and intact distal pulses.  tachycardic  Pulmonary/Chest: Effort normal and breath sounds normal. No respiratory distress. She has no wheezes.  Abdominal: Soft. Bowel sounds are normal. She exhibits no distension. There is no tenderness.  No TTP of suprapubic abd. +CVA of L side. Minimal TTP of L flank. No abd rigidity or guarding.   Musculoskeletal: Normal range of motion.    Neurological: She is alert and oriented to person, place, and time.  Skin: Skin is warm and dry.  Psychiatric: She has a normal mood and affect.  Nursing note and vitals reviewed.    ED Treatments / Results  Labs (all labs ordered are listed, but only abnormal results are displayed) Labs Reviewed  URINALYSIS, ROUTINE W REFLEX MICROSCOPIC - Abnormal; Notable for the following components:      Result Value   APPearance CLOUDY (*)    Hgb urine dipstick LARGE (*)    Ketones, ur 15 (*)    Protein, ur 100 (*)    Nitrite POSITIVE (*)    Leukocytes, UA MODERATE (*)    All other components within normal limits  CBC - Abnormal; Notable for the following components:   RBC 3.41 (*)    Hemoglobin 10.4 (*)    HCT 31.2 (*)    All other components within normal limits  COMPREHENSIVE METABOLIC PANEL - Abnormal; Notable for the following components:   Sodium 133 (*)    Potassium 2.5 (*)    Glucose, Bld 112 (*)    Calcium 8.5 (*)    Total Bilirubin 2.2 (*)    All other components within normal limits  URINALYSIS, MICROSCOPIC (REFLEX) - Abnormal; Notable for the following components:   Bacteria, UA MANY (*)    Squamous Epithelial / LPF 0-5 (*)    All other components within normal limits  URINE CULTURE  PREGNANCY, URINE    EKG  EKG Interpretation  Date/Time:  Thursday October 29 2017 17:34:27 EST Ventricular Rate:  95 PR Interval:    QRS Duration: 94 QT Interval:  355 QTC Calculation: 447 R Axis:   58 Text Interpretation:  Sinus rhythm RSR' in V1 or V2, right VCD or RVH No STEMI.  Confirmed by Alona Bene [phone]) on 10/29/2017 5:42:52 PM       Radiology Ct Renal Stone Study  Result Date: 10/29/2017 CLINICAL DATA:  Left flank pain with hematuria, fever, and chills. EXAM: CT ABDOMEN AND PELVIS WITHOUT CONTRAST TECHNIQUE: Multidetector CT imaging of the abdomen and pelvis was performed following the standard protocol without IV contrast. COMPARISON:  None. FINDINGS: Lower chest:  Trace pericardial effusion. Hepatobiliary: No focal liver abnormality is seen. No gallstones, gallbladder wall thickening, or biliary dilatation. Pancreas: Unremarkable. No pancreatic ductal dilatation or surrounding inflammatory changes. Spleen: Normal in size without focal abnormality. Adrenals/Urinary Tract: The adrenal glands are unremarkable. Bilateral extrarenal pelves. Borderline mild hydronephrosis bilaterally. No renal or ureteral calculi. Moderate circumferential bladder wall thickening. Stomach/Bowel: Stomach is within normal limits. Appendix appears normal. No evidence of bowel wall thickening, distention, or inflammatory changes. Vascular/Lymphatic: No significant vascular findings are present. No enlarged abdominal or pelvic lymph nodes. Reproductive: Uterus and bilateral adnexa are unremarkable. Other: No free fluid or pneumoperitoneum. Musculoskeletal:  No acute or significant osseous findings. IMPRESSION: 1. Borderline mild hydronephrosis bilaterally without evidence of obstructing calculus. Evaluation for pyelonephritis is limited without intravenous contrast. 2. Moderate circumferential bladder wall thickening, nonspecific, but can be seen with cystitis. Correlate with urinalysis. 3. Trace pericardial effusion. Electronically Signed   By: Obie DredgeWilliam T Derry M.D.   On: 10/29/2017 17:52    Procedures Procedures (including critical care time)  Medications Ordered in ED Medications  acetaminophen (TYLENOL) tablet 650 mg (650 mg Oral Given 10/29/17 1606)  sodium chloride 0.9 % bolus 1,000 mL (0 mLs Intravenous Stopped 10/29/17 1722)  ondansetron (ZOFRAN) injection 4 mg (4 mg Intravenous Given 10/29/17 1621)  morphine 4 MG/ML injection 4 mg (4 mg Intravenous Given 10/29/17 1620)  potassium chloride 10 mEq in 100 mL IVPB (0 mEq Intravenous Stopped 10/29/17 1839)  sodium chloride 0.9 % bolus 1,000 mL (0 mLs Intravenous Stopped 10/29/17 1839)  cefTRIAXone (ROCEPHIN) 1 g in dextrose 5 % 50 mL IVPB (0 g  Intravenous Stopped 10/29/17 1753)  ketorolac (TORADOL) 15 MG/ML injection 15 mg (15 mg Intravenous Given 10/29/17 1728)     Initial Impression / Assessment and Plan / ED Course  I have reviewed the triage vital signs and the nursing notes.  Pertinent labs & imaging results that were available during my care of the patient were reviewed by me and considered in my medical decision making (see chart for details).     Patient presenting for evaluation of nausea, vomiting, and urinary symptoms.  She has associated left-sided back pain.  Physical exam shows she is tachycardic and febrile.  No tenderness palpation of the abdomen, mild left-sided flank pain and CVA tenderness.  Will obtain urine, culture, labs.  Fluids, morphine, and Zofran given for symptom control.  CBC reassuring, no leukocytosis.  UA positive for UTI and showing large hemoglobin.  CMP shows hypokalemia and dehydration.  No change in creatinine.  Heart rate improving.  On reassessment, patient states pain is slightly improved, but not gone.  Will obtain CT renal to rule out kidney stone, although suspicion is low.  Potassium and Rocephin started IV.  Ketorolac given for further pain control.  CT renal without stone.  Shows mild bilateral hydronephrosis.  Shows trace pericardial effusion.  On reassessment, patient states she is pain-free and feeling better.  Patient does not want pelvic exam today, but states she will follow-up with her OB/GYN to rule out PID.  Discussed finding of pericardial effusion, patient to follow-up with cardiology.  At this time, likely pyelo as diagnosis.  Discussed with attending, and Dr. Jacqulyn BathLong agrees to plan. Will give antibiotics, potassium, and Zofran for symptom control.  Patient to follow-up with OB/GYN, primary, and cardiology.  At this time, patient appears safe for discharge.  Return precautions given.  Patient states she understands and agrees to plan.    Final Clinical Impressions(s) / ED Diagnoses     Final diagnoses:  Pyelonephritis  Hypokalemia  Pleural effusion    ED Discharge Orders        Ordered    cephALEXin (KEFLEX) 500 MG capsule  4 times daily     10/29/17 1805    potassium chloride (K-DUR) 10 MEQ tablet  Daily     10/29/17 1805    ondansetron (ZOFRAN) 4 MG tablet  Every 8 hours PRN     10/29/17 1821       Alveria ApleyCaccavale, Theodore Rahrig, PA-C 10/30/17 0050    Maia PlanLong, Joshua G, MD 10/30/17 1036

## 2017-10-31 LAB — URINE CULTURE: Culture: >=100000 — AB

## 2017-11-01 ENCOUNTER — Telehealth: Payer: Self-pay

## 2017-11-01 NOTE — Telephone Encounter (Signed)
Post ED Visit - Positive Culture Follow-up  Culture report reviewed by antimicrobial stewardship pharmacist:  []  Enzo BiNathan Batchelder, Pharm.D. []  Celedonio MiyamotoJeremy Frens, Pharm.D., BCPS AQ-ID [x]  Garvin FilaMike Maccia, Pharm.D., BCPS []  Georgina PillionElizabeth Martin, Pharm.D., BCPS []  Pleasant HillMinh Pham, [address] Rainbow BoulevardPharm.D., BCPS, AAHIVP []  Estella HuskMichelle Turner, Pharm.D., BCPS, AAHIVP []  Lysle Pearlachel Rumbarger, PharmD, BCPS []  Casilda Carlsaylor Stone, PharmD, BCPS []  Pollyann SamplesAndy Johnston, PharmD, BCPS  Positive urine culture Treated with Cephalexin, organism sensitive to the same and no further patient follow-up is required at this time.  Jerry CarasCullom, Margrett Kalb Burnett 11/01/2017, 11:51 AM

## 2017-12-14 ENCOUNTER — Ambulatory Visit (INDEPENDENT_AMBULATORY_CARE_PROVIDER_SITE_OTHER): Payer: Medicaid Other | Admitting: General Practice

## 2017-12-14 VITALS — BP 118/60 | HR 80 | Ht 63.0 in | Wt 121.0 lb

## 2017-12-14 DIAGNOSIS — Z3042 Encounter for surveillance of injectable contraceptive: Secondary | ICD-10-CM

## 2017-12-14 MED ORDER — MEDROXYPROGESTERONE ACETATE 150 MG/ML IM SUSP
150.0000 mg | Freq: Once | INTRAMUSCULAR | Status: AC
  Administered 2017-12-14: 150 mg via INTRAMUSCULAR

## 2017-12-14 NOTE — Progress Notes (Signed)
Depoprovera given

## 2017-12-29 ENCOUNTER — Encounter: Payer: Self-pay | Admitting: Obstetrics and Gynecology

## 2017-12-30 ENCOUNTER — Other Ambulatory Visit: Payer: Self-pay

## 2017-12-30 DIAGNOSIS — B9689 Other specified bacterial agents as the cause of diseases classified elsewhere: Secondary | ICD-10-CM

## 2017-12-30 DIAGNOSIS — N898 Other specified noninflammatory disorders of vagina: Secondary | ICD-10-CM

## 2017-12-30 DIAGNOSIS — N760 Acute vaginitis: Principal | ICD-10-CM

## 2017-12-30 MED ORDER — FLUCONAZOLE 150 MG PO TABS: 150.0000 mg | ORAL_TABLET | Freq: Once | ORAL | 0 refills | Status: AC

## 2017-12-30 MED ORDER — METRONIDAZOLE 500 MG PO TABS: 500.0000 mg | ORAL_TABLET | Freq: Two times a day (BID) | ORAL | 0 refills | Status: DC

## 2017-12-30 NOTE — Progress Notes (Signed)
Patient

## 2017-12-30 NOTE — Progress Notes (Signed)
Pt called the office requesting medication for BV symptoms and Fluconazole for yeast. Medication was sent to pharmacy.

## 2018-02-23 ENCOUNTER — Encounter: Payer: Self-pay | Admitting: *Deleted

## 2018-03-01 ENCOUNTER — Ambulatory Visit: Payer: Medicaid Other

## 2018-03-02 ENCOUNTER — Ambulatory Visit (INDEPENDENT_AMBULATORY_CARE_PROVIDER_SITE_OTHER): Payer: Medicaid Other | Admitting: Certified Nurse Midwife

## 2018-03-02 ENCOUNTER — Encounter: Payer: Self-pay | Admitting: Certified Nurse Midwife

## 2018-03-02 VITALS — BP 106/63 | HR 101 | Ht 63.0 in | Wt 125.0 lb

## 2018-03-02 DIAGNOSIS — Z01419 Encounter for gynecological examination (general) (routine) without abnormal findings: Secondary | ICD-10-CM

## 2018-03-02 DIAGNOSIS — Z3042 Encounter for surveillance of injectable contraceptive: Secondary | ICD-10-CM

## 2018-03-02 DIAGNOSIS — Z0000 Encounter for general adult medical examination without abnormal findings: Secondary | ICD-10-CM | POA: Diagnosis present

## 2018-03-02 LAB — POCT PREGNANCY, URINE: Preg Test, Ur: NEGATIVE

## 2018-03-02 MED ORDER — MEDROXYPROGESTERONE ACETATE 150 MG/ML IM SUSP
150.0000 mg | Freq: Once | INTRAMUSCULAR | Status: AC
  Administered 2018-03-02: 150 mg via INTRAMUSCULAR

## 2018-03-02 NOTE — Patient Instructions (Signed)
Place depot medroxyprogesterone acetate injection patient instructions here.

## 2018-03-02 NOTE — Progress Notes (Signed)
GYNECOLOGY ANNUAL PREVENTATIVE CARE ENCOUNTER NOTE  Subjective:   Cindy Ware is a 22 y.o. [phone] female here for a routine annual gynecologic exam.  Current complaints: none.   Denies abnormal vaginal bleeding, discharge, pelvic pain, problems with intercourse or other gynecologic concerns.    Gynecologic History No LMP recorded. Patient has had an injection. Contraception: Depo-Provera injections Last Pap: 01/2017. Results were: normal  Obstetric History OB History  Gravida Para Term Preterm AB Living  3 2 2  0 1 2  SAB TAB Ectopic Multiple Live Births  0 1 0 0 2    # Outcome Date GA Lbr Len/2nd Weight Sex Delivery Anes PTL Lv  3 Term 08/09/17 [redacted]w[redacted]d 04:54 / 00:06 7 lb 11.3 oz (3.495 kg) M Vag-Spont None  LIV  2 Term 03/12/14 [redacted]w[redacted]d 31:17 / 00:44 6 lb 3.5 oz (2.82 kg) M Vag-Spont EPI  LIV  1 TAB             Past Medical History:  Diagnosis Date  . Medical history non-contributory     Past Surgical History:  Procedure Laterality Date  . NO PAST SURGERIES    . THERAPEUTIC ABORTION      No current outpatient medications on file prior to visit.   No current facility-administered medications on file prior to visit.     No Known Allergies  Social History   Socioeconomic History  . Marital status: Single    Spouse name: Not on file  . Number of children: Not on file  . Years of education: Not on file  . Highest education level: Not on file  Occupational History  . Not on file  Social Needs  . Financial resource strain: Not on file  . Food insecurity:    Worry: Not on file    Inability: Not on file  . Transportation needs:    Medical: Not on file    Non-medical: Not on file  Tobacco Use  . Smoking status: Never Smoker  . Smokeless tobacco: Never Used  Substance and Sexual Activity  . Alcohol use: No  . Drug use: No  . Sexual activity: Yes    Birth control/protection: Injection  Lifestyle  . Physical activity:    Days per week: Not on file   Minutes per session: Not on file  . Stress: Not on file  Relationships  . Social connections:    Talks on phone: Not on file    Gets together: Not on file    Attends religious service: Not on file    Active member of club or organization: Not on file    Attends meetings of clubs or organizations: Not on file    Relationship status: Not on file  . Intimate partner violence:    Fear of current or ex partner: Not on file    Emotionally abused: Not on file    Physically abused: Not on file    Forced sexual activity: Not on file  Other Topics Concern  . Not on file  Social History Narrative  . Not on file    Family History  Problem Relation Age of Onset  . Hypertension Mother   . Asthma Maternal Uncle     The following portions of the patient's history were reviewed and updated as appropriate: allergies, current medications, past family history, past medical history, past social history, past surgical history and problem list.  Review of Systems Pertinent items noted in HPI and remainder of comprehensive ROS otherwise negative.  Objective:  BP 106/63   Pulse (!) 101   Ht 5\' 3"  (1.6 m)   Wt 125 lb (56.7 kg)   BMI 22.14 kg/m  CONSTITUTIONAL: Well-developed, well-nourished female in no acute distress.  HENT:  Normocephalic, atraumatic, External right and left ear normal. Oropharynx is clear and moist EYES: Conjunctivae and EOM are normal. Pupils are equal, round, and reactive to light. NECK: Normal range of motion, supple, no masses.  Normal thyroid.  SKIN: Skin is warm and dry. No rash noted. Not diaphoretic. No erythema. No pallor. NEUROLOGIC: Alert and oriented to person, place, and time. Normal reflexes, muscle tone coordination. No cranial nerve deficit noted. PSYCHIATRIC: Normal mood and affect. Normal behavior. Normal judgment and thought content. CARDIOVASCULAR: Normal heart rate noted, regular rhythm RESPIRATORY: Clear to auscultation bilaterally. Effort and breath  sounds normal, no problems with respiration noted. BREASTS: Symmetric in size. No masses, skin changes, nipple drainage, or lymphadenopathy. ABDOMEN: Soft, normal bowel sounds, no distention noted.  No tenderness, rebound or guarding.   Assessment and Plan:  1. Encounter for Medicare annual wellness exam -Normal well woman examination. No pap needed until 2021 with recommendations  -Patient reports wanting to continue Depo injections for contraception   2. Encounter for surveillance of injectable contraceptive -Next Depo injection between June 25th and July 9th.  -Pregnancy test: negative  - medroxyPROGESTERone (DEPO-PROVERA) injection 150 mg   Routine preventative health maintenance measures emphasized. Please refer to After Visit Summary for other counseling recommendations.    Steward DroneVeronica Ousman [facility]  [facility]

## 2018-05-10 ENCOUNTER — Encounter (HOSPITAL_COMMUNITY): Payer: Self-pay | Admitting: *Deleted

## 2018-05-10 ENCOUNTER — Emergency Department (HOSPITAL_COMMUNITY): Payer: Self-pay

## 2018-05-10 ENCOUNTER — Emergency Department (HOSPITAL_COMMUNITY)
Admission: EM | Admit: 2018-05-10 | Discharge: 2018-05-10 | Disposition: A | Discharge status: Home / Self Care | Payer: Self-pay | Attending: Emergency Medicine | Admitting: Emergency Medicine

## 2018-05-10 DIAGNOSIS — M25562 Pain in left knee: Secondary | ICD-10-CM | POA: Insufficient documentation

## 2018-05-10 NOTE — Discharge Instructions (Addendum)
Take ibuprofen and tylenol for the pain ° °

## 2018-05-10 NOTE — ED Notes (Signed)
Bed: WTR9 Expected date:  Expected time:  Means of arrival:  Comments: 

## 2018-05-10 NOTE — ED Notes (Signed)
Ortho notified to place knee immobilizer and crutches

## 2018-05-10 NOTE — ED Notes (Signed)
Bed: WTR5 Expected date:  Expected time:  Means of arrival:  Comments: 

## 2018-05-10 NOTE — ED Provider Notes (Signed)
[facility]-EMERGENCY DEPT Provider Note   CSN: [id number] Arrival date & time: 05/10/18  1031     History   Chief Complaint Chief Complaint  Patient presents with  . Knee Pain    left    HPI Cindy Ware is a 22 y.o. female.  HPI 22 year old female presents the emergency department left knee pain for 2 weeks since then additional injury while she was drinking alcohol with her friends when she stepped suddenly to the left and felt as though her left knee gave out.  She is had swelling of the left knee since with painful range of motion and painful ambulation.  No fevers or chills.  Seen recently in the emergency department had an x-ray that demonstrated no osseous abnormalities.  Continues to have discomfort and pain and thus came to the ER for repeat evaluation.   Past Medical History:  Diagnosis Date  . Medical history non-contributory     Patient Active Problem List   Diagnosis Date Noted  . Spontaneous vaginal delivery 08/09/2017  . Supervision of low-risk pregnancy 01/29/2017    Past Surgical History:  Procedure Laterality Date  . NO PAST SURGERIES    . THERAPEUTIC ABORTION       OB History    Gravida  3   Para  2   Term  2   Preterm  0   AB  1   Living  2     SAB  0   TAB  1   Ectopic  0   Multiple  0   Live Births  2            Home Medications    Prior to Admission medications   Not on File    Family History Family History  Problem Relation Age of Onset  . Hypertension Mother   . Asthma Maternal Uncle     Social History Social History   Tobacco Use  . Smoking status: Never Smoker  . Smokeless tobacco: Never Used  Substance Use Topics  . Alcohol use: No  . Drug use: No     Allergies   Patient has no known allergies.   Review of Systems Review of Systems  All other systems reviewed and are negative.    Physical Exam Updated Vital Signs BP 118/75 (BP Location: Right Arm)    Pulse 95   Temp 97.9 F (36.6 C) (Oral)   Resp 17   SpO2 97%   Physical Exam  Constitutional: She is oriented to person, place, and time. She appears well-developed and well-nourished.  HENT:  Head: Normocephalic.  Eyes: EOM are normal.  Neck: Normal range of motion.  Pulmonary/Chest: Effort normal.  Abdominal: She exhibits no distension.  Musculoskeletal: Normal range of motion.  Mild swelling of the left knee.  Mild painful range of motion.  Normal extension at the left knee.  Normal pulses left foot.  No swelling of the left lower extremity as compared to the right.  Neurological: She is alert and oriented to person, place, and time.  Psychiatric: She has a normal mood and affect.  Nursing note and vitals reviewed.    ED Treatments / Results  Labs (all labs ordered are listed, but only abnormal results are displayed) Labs Reviewed - No data to display  EKG None  Radiology Dg Knee Complete 4 Views Left  Result Date: 05/10/2018 CLINICAL DATA:  Initial encounter for Pt complains of left knee pain and swelling anteriorly and distal  femur area . Also states her left leg gave out and caused her to fall 2 days ago. EXAM: LEFT KNEE - COMPLETE 4+ VIEW COMPARISON:  None. FINDINGS: No acute fracture or dislocation. Joint spaces maintained. No joint effusion. IMPRESSION: No acute osseous abnormality. Electronically Signed   By: Jeronimo Greaves M.D.   On: 05/10/2018 11:46    Procedures Procedures (including critical care time)  Medications Ordered in ED Medications - No data to display   Initial Impression / Assessment and Plan / ED Course  I have reviewed the triage vital signs and the nursing notes.  Pertinent labs & imaging results that were available during my care of the patient were reviewed by me and considered in my medical decision making (see chart for details).     No indication for repeat imaging.  Patient will be placed in a knee immobilizer and crutches.   Weightbearing as tolerated.  Patient will need orthopedic follow-up for possible internal derangement.  She may benefit from outpatient MRI.  Final Clinical Impressions(s) / ED Diagnoses   Final diagnoses:  Acute pain of left knee    ED Discharge Orders    None       Azalia Bilis, MD 05/10/18 1256

## 2018-05-10 NOTE — ED Triage Notes (Signed)
Pt complains of left knee pain and swelling since her left leg gave out and caused her to fall 2 days ago. Pt has tried ice and ibuprofen w/o relief.

## 2018-05-18 ENCOUNTER — Ambulatory Visit: Payer: Medicaid Other

## 2018-05-24 ENCOUNTER — Encounter: Payer: Self-pay | Admitting: Obstetrics and Gynecology

## 2018-06-18 ENCOUNTER — Encounter (HOSPITAL_COMMUNITY): Payer: Self-pay | Admitting: Emergency Medicine

## 2018-06-18 ENCOUNTER — Emergency Department (HOSPITAL_COMMUNITY)
Admission: EM | Admit: 2018-06-18 | Discharge: 2018-06-18 | Disposition: A | Discharge status: Home / Self Care | Payer: Self-pay | Attending: Emergency Medicine | Admitting: Emergency Medicine

## 2018-06-18 DIAGNOSIS — N3000 Acute cystitis without hematuria: Secondary | ICD-10-CM | POA: Insufficient documentation

## 2018-06-18 DIAGNOSIS — N760 Acute vaginitis: Secondary | ICD-10-CM | POA: Insufficient documentation

## 2018-06-18 DIAGNOSIS — B373 Candidiasis of vulva and vagina: Secondary | ICD-10-CM | POA: Insufficient documentation

## 2018-06-18 DIAGNOSIS — B9689 Other specified bacterial agents as the cause of diseases classified elsewhere: Secondary | ICD-10-CM | POA: Insufficient documentation

## 2018-06-18 DIAGNOSIS — B3731 Acute candidiasis of vulva and vagina: Secondary | ICD-10-CM

## 2018-06-18 LAB — URINALYSIS, ROUTINE W REFLEX MICROSCOPIC
BILIRUBIN URINE: NEGATIVE
Glucose, UA: NEGATIVE mg/dL
Ketones, ur: NEGATIVE mg/dL
NITRITE: POSITIVE — AB
Protein, ur: 30 mg/dL — AB
SPECIFIC GRAVITY, URINE: 1.027 (ref 1.005–1.030)
Squamous Epithelial / LPF: >50 — ABNORMAL HIGH (ref 0–5)
pH: 5 (ref 5.0–8.0)

## 2018-06-18 LAB — WET PREP, GENITAL
Sperm: NONE SEEN
Trich, Wet Prep: NONE SEEN

## 2018-06-18 LAB — POC URINE PREG, ED: Preg Test, Ur: NEGATIVE

## 2018-06-18 LAB — GC/CHLAMYDIA PROBE AMP (~~LOC~~) NOT AT ARMC
CHLAMYDIA, DNA PROBE: NEGATIVE
NEISSERIA GONORRHEA: POSITIVE — AB

## 2018-06-18 MED ORDER — FLUCONAZOLE 150 MG PO TABS: 150.0000 mg | ORAL_TABLET | Freq: Every day | ORAL | 0 refills | Status: AC

## 2018-06-18 MED ORDER — METRONIDAZOLE 500 MG PO TABS: 500.0000 mg | ORAL_TABLET | Freq: Two times a day (BID) | ORAL | 0 refills | Status: AC

## 2018-06-18 MED ORDER — CEPHALEXIN 500 MG PO CAPS: 500.0000 mg | ORAL_CAPSULE | Freq: Two times a day (BID) | ORAL | 0 refills | Status: AC

## 2018-06-18 NOTE — ED Provider Notes (Addendum)
[facility]-EMERGENCY DEPT Provider Note   CSN: [id number] Arrival date & time: 06/18/18  0701     History   Chief Complaint Chief Complaint  Patient presents with  . Vaginal Itching    HPI Cindy Ware is a 22 y.o. female.  22 year old female G3P2 with a past medical history of Bacterial Vaginosis and Trichomonas presents to the ED with a chief complaint of vaginal itching x 3 days. Patient describes the itching as severe around the outer area only. Patient also reports some different odor to her urine x 1 week, she describes it as fishy.  Patient also reports some vaginal spotting, she recently came off the Depo-Provera a month ago.  She also reports some vaginal discharge but states that she cannot tell the difference in texture or smell.  Patient denies any recent changes in soaps, new partners.  Patient has had a yeast infection in the past and reports this does not feel like one.  She denies any fever, abdominal pain, nausea vomiting or diarrhea.     Past Medical History:  Diagnosis Date  . Medical history non-contributory     Patient Active Problem List   Diagnosis Date Noted  . Spontaneous vaginal delivery 08/09/2017  . Supervision of low-risk pregnancy 01/29/2017    Past Surgical History:  Procedure Laterality Date  . NO PAST SURGERIES    . THERAPEUTIC ABORTION       OB History    Gravida  3   Para  2   Term  2   Preterm  0   AB  1   Living  2     SAB  0   TAB  1   Ectopic  0   Multiple  0   Live Births  2            Home Medications    Prior to Admission medications   Medication Sig Start Date End Date Taking? Authorizing Provider  cephALEXin (KEFLEX) 500 MG capsule Take 1 capsule (500 mg total) by mouth 2 (two) times daily for 7 days. 06/18/18 06/25/18  Claude Manges, PA-C  fluconazole (DIFLUCAN) 150 MG tablet Take 1 tablet (150 mg total) by mouth daily for 1 dose. 06/18/18 06/19/18  Claude Manges, PA-C    metroNIDAZOLE (FLAGYL) 500 MG tablet Take 1 tablet (500 mg total) by mouth 2 (two) times daily for 7 days. 06/18/18 06/25/18  Claude Manges, PA-C    Family History Family History  Problem Relation Age of Onset  . Hypertension Mother   . Asthma Maternal Uncle     Social History Social History   Tobacco Use  . Smoking status: Never Smoker  . Smokeless tobacco: Never Used  Substance Use Topics  . Alcohol use: No  . Drug use: No     Allergies   Patient has no known allergies.   Review of Systems Review of Systems  Constitutional: Negative for chills and fever.  HENT: Negative for ear pain, sinus pain and sore throat.   Eyes: Negative for pain and visual disturbance.  Respiratory: Negative for cough and shortness of breath.   Cardiovascular: Negative for chest pain and palpitations.  Gastrointestinal: Negative for abdominal pain and vomiting.  Genitourinary: Positive for vaginal bleeding and vaginal discharge. Negative for difficulty urinating, dysuria, flank pain, hematuria, pelvic pain and vaginal pain.  Musculoskeletal: Negative for arthralgias and back pain.  Skin: Negative for color change and rash.  Neurological: Negative for seizures, syncope, light-headedness and headaches.  All other systems reviewed and are negative.    Physical Exam Updated Vital Signs BP 117/83 (BP Location: Left Arm)   Pulse (!) 101   Temp 99 F (37.2 C)   Resp 18   SpO2 99%   Physical Exam  Constitutional: She is oriented to person, place, and time. She appears well-developed and well-nourished. No distress.  HENT:  Head: Normocephalic and atraumatic.  Mouth/Throat: Oropharynx is clear and moist. No oropharyngeal exudate.  Eyes: Pupils are equal, round, and reactive to light. No scleral icterus.  Neck: Normal range of motion.  Cardiovascular: Regular rhythm and normal heart sounds.  Pulmonary/Chest: Effort normal and breath sounds normal. No respiratory distress. She has no wheezes.  She exhibits no tenderness.  Abdominal: Soft. Bowel sounds are normal. She exhibits no distension. There is no tenderness.  Genitourinary: Pelvic exam was performed with patient supine. There is no rash, tenderness or injury on the right labia. There is no rash, tenderness or injury on the left labia. Cervix exhibits no motion tenderness. Right adnexum displays no tenderness. Left adnexum displays no tenderness. There is erythema and bleeding in the vagina. No tenderness in the vagina. Vaginal discharge found.    Genitourinary Comments: There is foul odor discharge presents, no CMT tenderness, no adnexa tenderness.  Small pearly papule noted at 3pm.   Musculoskeletal: She exhibits no tenderness or deformity.       Right lower leg: She exhibits no edema.       Left lower leg: She exhibits no edema.  Neurological: She is alert and oriented to person, place, and time.  Skin: Skin is warm and dry. Capillary refill takes less than 2 seconds. No rash noted. No erythema.  Psychiatric: She has a normal mood and affect.  Nursing note and vitals reviewed.    ED Treatments / Results  Labs (all labs ordered are listed, but only abnormal results are displayed) Labs Reviewed  WET PREP, GENITAL - Abnormal; Notable for the following components:      Result Value   Yeast Wet Prep HPF POC PRESENT (*)    Clue Cells Wet Prep HPF POC PRESENT (*)    WBC, Wet Prep HPF POC FEW (*)    All other components within normal limits  URINALYSIS, ROUTINE W REFLEX MICROSCOPIC - Abnormal; Notable for the following components:   Color, Urine AMBER (*)    APPearance CLOUDY (*)    Hgb urine dipstick LARGE (*)    Protein, ur 30 (*)    Nitrite POSITIVE (*)    Leukocytes, UA SMALL (*)    Bacteria, UA MANY (*)    Squamous Epithelial / LPF >50 (*)    All other components within normal limits  POC URINE PREG, ED  GC/CHLAMYDIA PROBE AMP ([place]) NOT AT [facility]    EKG None  Radiology No results  found.  Procedures Procedures (including critical care time)  Medications Ordered in ED Medications - No data to display   Initial Impression / Assessment and Plan / ED Course  I have reviewed the triage vital signs and the nursing notes.  Pertinent labs & imaging results that were available during my care of the patient were reviewed by me and considered in my medical decision making (see chart for details).    Pelvic exam reveals copious discharge, and foul fishy odor present.Chaperone by [employer].Patient urinalysis shows positive nitrates, leukocytes, bacteria many.  There are also clue cells, white blood cells, yeast present on wet prep.At this time  I will treat patient for BV,UTI and yeast infection. There is no flank pain upon exam and patient is afebrile, I believe she is alone nephritis is less likely.I have dicussed this patient with Dr. Estell HarpinZammit, he agrees with my plan and management.  Precautions provided to patient.   Final Clinical Impressions(s) / ED Diagnoses   Final diagnoses:  Acute cystitis without hematuria  Bacterial vaginosis  Yeast vaginitis    ED Discharge Orders        Ordered    fluconazole (DIFLUCAN) 150 MG tablet  Daily     06/18/18 0909    metroNIDAZOLE (FLAGYL) 500 MG tablet  2 times daily     06/18/18 0909    cephALEXin (KEFLEX) 500 MG capsule  2 times daily     06/18/18 0909       Claude MangesSoto, Merlene Dante, PA-C 06/18/18 0931    Claude MangesSoto, Teodoro Jeffreys, PA-C 06/18/18 0945    Tilden Fossaees, Elizabeth, MD 06/23/18 [phone]

## 2018-06-18 NOTE — ED Notes (Signed)
Patient verbalized understanding of discharge instructions, no questions. Patient ambulated out of ED with steady gait in no distress.  

## 2018-06-18 NOTE — Discharge Instructions (Addendum)
Please return to the ED if you experience any of the following symptoms:  Your symptoms do not improve, even after treatment. You have more discharge or pain when urinating. You have a fever. You have pain in your abdomen. You have pain during sex. You have vaginal bleeding between periods.

## 2018-06-18 NOTE — ED Triage Notes (Signed)
Patient here from home with complaints of vaginal irritation x3 days. Discharge increased. No new sexual partners.

## 2018-09-12 ENCOUNTER — Other Ambulatory Visit: Payer: Self-pay

## 2018-09-12 ENCOUNTER — Emergency Department (HOSPITAL_COMMUNITY): Payer: Self-pay

## 2018-09-12 ENCOUNTER — Encounter (HOSPITAL_COMMUNITY): Payer: Self-pay | Admitting: Emergency Medicine

## 2018-09-12 ENCOUNTER — Emergency Department (HOSPITAL_COMMUNITY)
Admission: EM | Admit: 2018-09-12 | Discharge: 2018-09-12 | Disposition: A | Discharge status: Home / Self Care | Payer: Self-pay | Attending: Emergency Medicine | Admitting: Emergency Medicine

## 2018-09-12 DIAGNOSIS — Z3A01 Less than 8 weeks gestation of pregnancy: Secondary | ICD-10-CM | POA: Insufficient documentation

## 2018-09-12 DIAGNOSIS — O209 Hemorrhage in early pregnancy, unspecified: Secondary | ICD-10-CM | POA: Insufficient documentation

## 2018-09-12 DIAGNOSIS — O4690 Antepartum hemorrhage, unspecified, unspecified trimester: Secondary | ICD-10-CM

## 2018-09-12 LAB — CBC
HCT: 38.8 % (ref 36.0–46.0)
Hemoglobin: 12.3 g/dL (ref 12.0–15.0)
MCH: 30.2 pg (ref 26.0–34.0)
MCHC: 31.7 g/dL (ref 30.0–36.0)
MCV: 95.3 fL (ref 80.0–100.0)
Platelets: 301 10*3/uL (ref 150–400)
RBC: 4.07 MIL/uL (ref 3.87–5.11)
RDW: 12.4 % (ref 11.5–15.5)
WBC: 11.2 10*3/uL — AB (ref 4.0–10.5)
nRBC: 0 % (ref 0.0–0.2)

## 2018-09-12 LAB — WET PREP, GENITAL
SPERM: NONE SEEN
TRICH WET PREP: NONE SEEN
Yeast Wet Prep HPF POC: NONE SEEN

## 2018-09-12 LAB — URINALYSIS, ROUTINE W REFLEX MICROSCOPIC
BILIRUBIN URINE: NEGATIVE
Glucose, UA: NEGATIVE mg/dL
Ketones, ur: NEGATIVE mg/dL
Leukocytes, UA: NEGATIVE
NITRITE: NEGATIVE
PH: 8 (ref 5.0–8.0)
Protein, ur: NEGATIVE mg/dL
SPECIFIC GRAVITY, URINE: 1.009 (ref 1.005–1.030)

## 2018-09-12 LAB — COMPREHENSIVE METABOLIC PANEL
ALT: 10 U/L (ref 0–44)
AST: 14 U/L — ABNORMAL LOW (ref 15–41)
Albumin: 3.8 g/dL (ref 3.5–5.0)
Alkaline Phosphatase: 71 U/L (ref 38–126)
Anion gap: 5 (ref 5–15)
BILIRUBIN TOTAL: 1.1 mg/dL (ref 0.3–1.2)
BUN: 5 mg/dL — AB (ref 6–20)
CO2: 26 mmol/L (ref 22–32)
CREATININE: 0.52 mg/dL (ref 0.44–1.00)
Calcium: 9.4 mg/dL (ref 8.9–10.3)
Chloride: 107 mmol/L (ref 98–111)
GFR calc Af Amer: >60 mL/min (ref >60)
Glucose, Bld: 91 mg/dL (ref 70–99)
POTASSIUM: 4 mmol/L (ref 3.5–5.1)
SODIUM: 138 mmol/L (ref 135–145)
TOTAL PROTEIN: 7.2 g/dL (ref 6.5–8.1)

## 2018-09-12 LAB — I-STAT BETA HCG BLOOD, ED (MC, WL, AP ONLY): I-stat hCG, quantitative: 1062.9 m[IU]/mL — ABNORMAL HIGH (ref <5)

## 2018-09-12 LAB — HCG, QUANTITATIVE, PREGNANCY: hCG, Beta Chain, Quant, S: 1295 m[IU]/mL — ABNORMAL HIGH (ref <5)

## 2018-09-12 LAB — LIPASE, BLOOD: Lipase: 34 U/L (ref 11–51)

## 2018-09-12 MED ORDER — ACETAMINOPHEN 325 MG PO TABS
650.0000 mg | ORAL_TABLET | Freq: Once | ORAL | Status: AC
  Administered 2018-09-12: 650 mg via ORAL
  Filled 2018-09-12: qty 2

## 2018-09-12 NOTE — ED Provider Notes (Signed)
[facility] EMERGENCY DEPARTMENT Provider Note   CSN: [id number] Arrival date & time: 09/12/18  1516     History   Chief Complaint Chief Complaint  Patient presents with  . Abdominal Pain    HPI Cindy Ware is a 22 y.o. female.  HPI   Cindy Ware is a 22 y.o. female, with a history of G3P2, presenting to the ED with vaginal bleeding beginning yesterday.  States she has used one tampon in the last 24 hours.  Began to experience lower abdominal cramping today, mild to moderate, intermittent, nonradiating. States she has had a positive pregnancy test with LMP August 04, 2018.  She has not been to see OB/GYN.  Denies fever/chills, N/V/D, chest pain, shortness of breath, dizziness, syncope, or any other complaints.     Past Medical History:  Diagnosis Date  . Medical history non-contributory     Patient Active Problem List   Diagnosis Date Noted  . Spontaneous vaginal delivery 08/09/2017  . Supervision of low-risk pregnancy 01/29/2017    Past Surgical History:  Procedure Laterality Date  . NO PAST SURGERIES    . THERAPEUTIC ABORTION       OB History    Gravida  3   Para  2   Term  2   Preterm  0   AB  1   Living  2     SAB  0   TAB  1   Ectopic  0   Multiple  0   Live Births  2            Home Medications    Prior to Admission medications   Not on File    Family History Family History  Problem Relation Age of Onset  . Hypertension Mother   . Asthma Maternal Uncle     Social History Social History   Tobacco Use  . Smoking status: Never Smoker  . Smokeless tobacco: Never Used  Substance Use Topics  . Alcohol use: No  . Drug use: No     Allergies   Patient has no known allergies.   Review of Systems Review of Systems  Constitutional: Negative for chills, diaphoresis and fever.  Respiratory: Negative for shortness of breath.   Cardiovascular: Negative for chest pain.    Gastrointestinal: Positive for abdominal pain. Negative for diarrhea, nausea and vomiting.  Genitourinary: Positive for vaginal bleeding.  Musculoskeletal: Negative for back pain.  Neurological: Negative for dizziness, syncope, weakness and light-headedness.  All other systems reviewed and are negative.    Physical Exam Updated Vital Signs BP 121/67 (BP Location: Right Arm)   Pulse 85   Temp 98.1 F (36.7 C) (Oral)   Resp 16   Ht 5\' 3"  (1.6 m)   Wt 59 kg   LMP 08/04/2018   SpO2 100%   BMI 23.03 kg/m   Physical Exam  Constitutional: She appears well-developed and well-nourished. No distress.  HENT:  Head: Normocephalic and atraumatic.  Eyes: Conjunctivae are normal.  Neck: Neck supple.  Cardiovascular: Normal rate, regular rhythm, normal heart sounds and intact distal pulses.  Pulmonary/Chest: Effort normal and breath sounds normal. No respiratory distress.  Abdominal: Soft. There is tenderness in the suprapubic area. There is no guarding.  Patient indicates pain in the region shown verbally only and has no other reaction.  Genitourinary:  Genitourinary Comments: External genitalia normal Vagina with discharge - what appears to be a small amount of blood mixed with mucus in the  vaginal vault. Cervix  Normal - cervical os appears to be closed. negative for cervical motion tenderness Adnexa palpated, no masses, negative for tenderness noted Bladder palpated negative for tenderness Uterus palpated no masses, negative for tenderness  No inguinal lymphadenopathy. Otherwise normal female genitalia. Med Tech, [place], served as chaperone during exam.  Musculoskeletal: She exhibits no edema.  Lymphadenopathy:    She has no cervical adenopathy.  Neurological: She is alert.  Skin: Skin is warm and dry. She is not diaphoretic.  Psychiatric: She has a normal mood and affect. Her behavior is normal.  Nursing note and vitals reviewed.    ED Treatments / Results  Labs (all  labs ordered are listed, but only abnormal results are displayed) Labs Reviewed  WET PREP, GENITAL - Abnormal; Notable for the following components:      Result Value   Clue Cells Wet Prep HPF POC PRESENT (*)    WBC, Wet Prep HPF POC MODERATE (*)    All other components within normal limits  COMPREHENSIVE METABOLIC PANEL - Abnormal; Notable for the following components:   BUN 5 (*)    AST 14 (*)    All other components within normal limits  CBC - Abnormal; Notable for the following components:   WBC 11.2 (*)    All other components within normal limits  URINALYSIS, ROUTINE W REFLEX MICROSCOPIC - Abnormal; Notable for the following components:   Color, Urine STRAW (*)    Hgb urine dipstick LARGE (*)    Bacteria, UA RARE (*)    All other components within normal limits  HCG, QUANTITATIVE, PREGNANCY - Abnormal; Notable for the following components:   hCG, Beta Chain, Quant, S 1,295 (*)    All other components within normal limits  I-STAT BETA HCG BLOOD, ED ([facility], [facility], [facility] ONLY) - Abnormal; Notable for the following components:   I-stat hCG, quantitative 1,062.9 (*)    All other components within normal limits  LIPASE, BLOOD  GC/CHLAMYDIA PROBE AMP ([place]) NOT AT [facility]    EKG None  Radiology US Ob Comp < 14 Wks  Result Date: 09/12/2018 CLINICAL DATA:  Pelvic cramping, vaginal bleeding EXAM: OBSTETRIC <14 WK [place] AND TRANSVAGINAL OB US TECHNIQUE: Both transabdominal and transvaginal ultrasound examinations were performed for complete evaluation of the gestation as well as the maternal uterus, adnexal regions, and pelvic cul-de-sac. Transvaginal technique was performed to assess early pregnancy. COMPARISON:  None. FINDINGS: Intrauterine gestational sac: Not visualized Yolk sac:  Not visualized Embryo:  Not visualized Cardiac Activity: Heart Rate:   bpm MSD:   mm    w     d CRL:    mm    w    d                  [place] EDC: Subchorionic hemorrhage:  None visualized. Maternal uterus/adnexae: No  adnexal mass or free fluid. A small collection of fluid noted in the endometrial canal within the lower uterine segment or cervix. This does not appear to represent a gestational sac, but fluid within the canal. IMPRESSION: No intrauterine pregnancy visualized. Differential considerations would include early intrauterine pregnancy too early to visualize, spontaneous abortion, or occult ectopic pregnancy. Recommend close clinical followup and serial quantitative beta HCGs and ultrasounds. Electronically Signed   By: Charlett Nose M.D.   On: 09/12/2018 19:52   US Ob Transvaginal  Result Date: 09/12/2018 CLINICAL DATA:  Pelvic cramping, vaginal bleeding EXAM: OBSTETRIC <14 WK [place] AND TRANSVAGINAL OB US TECHNIQUE:  Both transabdominal and transvaginal ultrasound examinations were performed for complete evaluation of the gestation as well as the maternal uterus, adnexal regions, and pelvic cul-de-sac. Transvaginal technique was performed to assess early pregnancy. COMPARISON:  None. FINDINGS: Intrauterine gestational sac: Not visualized Yolk sac:  Not visualized Embryo:  Not visualized Cardiac Activity: Heart Rate:   bpm MSD:   mm    w     d CRL:    mm    w    d                  [place] EDC: Subchorionic hemorrhage:  None visualized. Maternal uterus/adnexae: No adnexal mass or free fluid. A small collection of fluid noted in the endometrial canal within the lower uterine segment or cervix. This does not appear to represent a gestational sac, but fluid within the canal. IMPRESSION: No intrauterine pregnancy visualized. Differential considerations would include early intrauterine pregnancy too early to visualize, spontaneous abortion, or occult ectopic pregnancy. Recommend close clinical followup and serial quantitative beta HCGs and ultrasounds. Electronically Signed   By: Charlett Nose M.D.   On: 09/12/2018 19:52    Procedures Procedures (including critical care time)  Medications Ordered in ED Medications    acetaminophen (TYLENOL) tablet 650 mg (has no administration in time range)     Initial Impression / Assessment and Plan / ED Course  I have reviewed the triage vital signs and the nursing notes.  Pertinent labs & imaging results that were available during my care of the patient were reviewed by me and considered in my medical decision making (see chart for details).     Patient presents with vaginal bleeding and the setting of pregnancy.  Gestational age estimated to be approximately [redacted]w[redacted]d. Patient is nontoxic appearing, afebrile, not tachycardic, not tachypneic, not hypotensive, maintains excellent SPO2 on room air, and is in no apparent distress.  Lab results overall reassuring.  No intrauterine pregnancy noted on ultrasound, but regardless suspect it is too early.  She has been instructed to follow-up with OB/GYN in 48 hours for repeat testing.  Return precautions discussed.  Patient voices understanding of these instructions, accepts the plan, and is comfortable with discharge.     Final Clinical Impressions(s) / ED Diagnoses   Final diagnoses:  Vaginal bleeding during pregnancy    ED Discharge Orders    None       Concepcion Living 09/12/18 2028    Gwyneth Sprout, MD 09/12/18 2119

## 2018-09-12 NOTE — ED Triage Notes (Signed)
Co lower abd cramping x 1 hour.  Denies nausea, vomiting, diarrhea, and constipation.  Reports frequent urination. LMP 08/04/18.

## 2018-09-12 NOTE — Discharge Instructions (Addendum)
Please have a repeat quantitative hCG blood test performed in 2 days.  This can be accomplished at the [facility], an OB/GYN clinic, or at the [facility] emergency room. Proceed to the [facility] emergency room should you have increased bleeding, increased pain, fever, persistent vomiting, passing out, or any other major concerns.

## 2018-09-12 NOTE — ED Notes (Signed)
PT and family member were standing outside of room and pt states she has been here for hours and she is getting irritated because she is supposed to go for an ultrasound and no one has came to get her. Pt began yelling at me in the room when I explained that I could not give her a time frame but she needs to be dressed in the gown so she is ready when they come to get her. Pt continued to scream at me but got dressed. Apologized and left room. Pts family member came back out to apologize to staff and said this behavior is not like her.

## 2018-09-13 LAB — GC/CHLAMYDIA PROBE AMP (~~LOC~~) NOT AT ARMC
CHLAMYDIA, DNA PROBE: NEGATIVE
NEISSERIA GONORRHEA: NEGATIVE

## 2018-09-14 ENCOUNTER — Inpatient Hospital Stay (HOSPITAL_COMMUNITY)
Admission: AD | Admit: 2018-09-14 | Discharge: 2018-09-14 | Disposition: A | Discharge status: Home / Self Care | Payer: Self-pay | Source: Ambulatory Visit | Attending: Obstetrics and Gynecology | Admitting: Obstetrics and Gynecology

## 2018-09-14 ENCOUNTER — Encounter: Payer: Self-pay | Admitting: Student

## 2018-09-14 DIAGNOSIS — O26891 Other specified pregnancy related conditions, first trimester: Secondary | ICD-10-CM

## 2018-09-14 DIAGNOSIS — Z6791 Unspecified blood type, Rh negative: Secondary | ICD-10-CM

## 2018-09-14 DIAGNOSIS — O039 Complete or unspecified spontaneous abortion without complication: Secondary | ICD-10-CM

## 2018-09-14 LAB — URINALYSIS, ROUTINE W REFLEX MICROSCOPIC
Bilirubin Urine: NEGATIVE
Glucose, UA: NEGATIVE mg/dL
KETONES UR: NEGATIVE mg/dL
Nitrite: NEGATIVE
PH: 5 (ref 5.0–8.0)
PROTEIN: 30 mg/dL — AB
Specific Gravity, Urine: 1.025 (ref 1.005–1.030)

## 2018-09-14 LAB — HCG, QUANTITATIVE, PREGNANCY: hCG, Beta Chain, Quant, S: 147 m[IU]/mL — ABNORMAL HIGH (ref <5)

## 2018-09-14 MED ORDER — RHO D IMMUNE GLOBULIN 1500 UNIT/2ML IJ SOSY
300.0000 ug | PREFILLED_SYRINGE | Freq: Once | INTRAMUSCULAR | Status: AC
  Administered 2018-09-14: 300 ug via INTRAMUSCULAR
  Filled 2018-09-14: qty 2

## 2018-09-14 NOTE — MAU Provider Note (Signed)
Chief Complaint: Vaginal Bleeding   First Provider Initiated Contact with Patient 09/14/18 1714     SUBJECTIVE HPI: Cindy Ware is a 22 y.o. [id number] at [redacted]w[redacted]d who presents to Maternity Admissions reporting vaginal bleeding and abdominal cramping. Was seen at ED 2 days ago for same issue. Had ultrasound that showed no IUP with HCG of 1200. Was told to f/u for HCG check. States vaginal bleeding & cramping has continued but not worsened since her recent ED visit. Has bright red blood that she has to wear a pad for. Is not saturating pads or passing clots.   Location: lower abdomen Quality: cramping Severity: 4/10 on pain scale Duration: 2 days Timing: intermittent Modifying factors: none Associated signs and symptoms: vaginal bleeding  Past Medical History:  Diagnosis Date  . Medical history non-contributory    OB History  Gravida Para Term Preterm AB Living  4 2 2  0 1 2  SAB TAB Ectopic Multiple Live Births  0 1 0 0 2    # Outcome Date GA Lbr Len/2nd Weight Sex Delivery Anes PTL Lv  4 Current           3 Term 08/09/17 [redacted]w[redacted]d 04:54 / 00:06 3495 g M Vag-Spont None  LIV  2 Term 03/12/14 [redacted]w[redacted]d 31:17 / 00:44 2820 g M Vag-Spont EPI  LIV  1 TAB            Past Surgical History:  Procedure Laterality Date  . THERAPEUTIC ABORTION     Social History   Socioeconomic History  . Marital status: Single    Spouse name: Not on file  . Number of children: Not on file  . Years of education: Not on file  . Highest education level: Not on file  Occupational History  . Not on file  Social Needs  . Financial resource strain: Not on file  . Food insecurity:    Worry: Not on file    Inability: Not on file  . Transportation needs:    Medical: Not on file    Non-medical: Not on file  Tobacco Use  . Smoking status: Never Smoker  . Smokeless tobacco: Never Used  Substance and Sexual Activity  . Alcohol use: No  . Drug use: No  . Sexual activity: Yes    Birth control/protection:  Injection  Lifestyle  . Physical activity:    Days per week: Not on file    Minutes per session: Not on file  . Stress: Not on file  Relationships  . Social connections:    Talks on phone: Not on file    Gets together: Not on file    Attends religious service: Not on file    Active member of club or organization: Not on file    Attends meetings of clubs or organizations: Not on file    Relationship status: Not on file  . Intimate partner violence:    Fear of current or ex partner: Not on file    Emotionally abused: Not on file    Physically abused: Not on file    Forced sexual activity: Not on file  Other Topics Concern  . Not on file  Social History Narrative  . Not on file   Family History  Problem Relation Age of Onset  . Hypertension Mother   . Asthma Maternal Uncle    No current facility-administered medications on file prior to encounter.    No current outpatient medications on file prior to encounter.   No Known  Allergies  I have reviewed patient's Past Medical Hx, Surgical Hx, Family Hx, Social Hx, medications and allergies.   Review of Systems  Constitutional: Negative.   Gastrointestinal: Positive for abdominal pain.  Genitourinary: Positive for vaginal bleeding.    OBJECTIVE Patient Vitals for the past 24 hrs:  BP Temp Temp src Pulse Resp Height Weight  09/14/18 1936 (!) 109/57 - - - - - -  09/14/18 1715 112/72 98.4 F (36.9 C) Oral (!) 108 18 - -  09/14/18 1700 - - - - - 5\' 3"  (1.6 m) 59 kg   Constitutional: Well-developed, well-nourished female in no acute distress.  Cardiovascular: normal rate & rhythm, no murmur Respiratory: normal rate and effort. Lung sounds clear throughout GI: Abd soft, non-tender, Pos BS x 4. No guarding or rebound tenderness MS: Extremities nontender, no edema, normal ROM Neurologic: Alert and oriented x 4.  GU:     SPECULUM EXAM: NEFG, physiologic discharge, small amount of dark red blood  BIMANUAL: No CMT. cervix  closed; uterus normal size, no adnexal tenderness or masses.    LAB RESULTS Results for orders placed or performed during the hospital encounter of 09/14/18 (from the past 24 hour(s))  Urinalysis, Routine w reflex microscopic     Status: Abnormal   Collection Time: 09/14/18  5:15 PM  Result Value Ref Range   Color, Urine YELLOW YELLOW   APPearance CLOUDY (A) CLEAR   Specific Gravity, Urine 1.025 1.005 - 1.030   pH 5.0 5.0 - 8.0   Glucose, UA NEGATIVE NEGATIVE mg/dL   Hgb urine dipstick LARGE (A) NEGATIVE   Bilirubin Urine NEGATIVE NEGATIVE   Ketones, ur NEGATIVE NEGATIVE mg/dL   Protein, ur 30 (A) NEGATIVE mg/dL   Nitrite NEGATIVE NEGATIVE   Leukocytes, UA MODERATE (A) NEGATIVE   RBC / HPF 6-10 0 - 5 RBC/hpf   WBC, UA >50 (H) 0 - 5 WBC/hpf   Bacteria, UA MANY (A) NONE SEEN   Squamous Epithelial / LPF 11-20 0 - 5   Mucus PRESENT   Rh IG workup (includes ABO/Rh)     Status: None (Preliminary result)   Collection Time: 09/14/18  5:52 PM  Result Value Ref Range   Gestational Age(Wks) 5    ABO/RH(D) A NEG    Antibody Screen NEG    Unit Number [id number]    Blood Component Type RHIG    Unit division 00    Status of Unit ISSUED    Transfusion Status      OK TO TRANSFUSE Performed at [facility], [address] [place], [place] [address]   hCG, quantitative, pregnancy     Status: Abnormal   Collection Time: 09/14/18  5:52 PM  Result Value Ref Range   hCG, Beta Chain, Quant, S 147 (H) <5 mIU/mL  BLOOD TRANSFUSION REPORT - SCANNED     Status: None   Collection Time: 09/15/18 11:29 AM   Narrative   Ordered by an unspecified provider.    IMAGING No results found.  MAU COURSE Orders Placed This Encounter  Procedures  . Urinalysis, Routine w reflex microscopic  . hCG, quantitative, pregnancy  . Rh IG workup (includes ABO/Rh)  . BLOOD TRANSFUSION REPORT - SCANNED  . Discharge patient   Meds ordered this encounter  Medications  . rho (d) immune globulin  (RHIG/RHOPHYLAC) injection 300 mcg    MDM RH negative, did not receive rhogam in ED, will give today Bleeding stable Significant drop in HCG 1295>147, c/w miscarriage  ASSESSMENT 1. Miscarriage  2. Rh negative state in antepartum period, first trimester     PLAN Discharge home in stable condition. Bleeding precautions Msg to clinic for SAB f/u appts  Follow-up Information    Department, [facility] Follow up.   Contact information: [address] [place] [address] [phone]        [facility] Follow up.   Contact information: [phone]  [address] [place] [address]         Allergies as of 09/14/2018   No Known Allergies     Medication List    You have not been prescribed any medications.      Judeth Horn, NP 09/15/2018  11:52 AM

## 2018-09-14 NOTE — Discharge Instructions (Signed)

## 2018-09-14 NOTE — MAU Note (Addendum)
Pt presents to MAU with c/o vaginal bleeding x 2 days, she was seen at [facility] on 10/20 for bleeding and had confirmed pregnancy with HCG levels, U/S did not show IUP. Since then bleeding has persisted along with cramping.

## 2018-09-15 LAB — RH IG WORKUP (INCLUDES ABO/RH)
ABO/RH(D): A NEG
ANTIBODY SCREEN: NEGATIVE
Gestational Age(Wks): 5
Unit division: 0

## 2018-09-21 ENCOUNTER — Other Ambulatory Visit: Payer: Self-pay

## 2018-09-28 ENCOUNTER — Ambulatory Visit: Payer: Self-pay | Admitting: Family Medicine

## 2019-01-17 IMAGING — US US MFM OB FOLLOW-UP
1 series · 13 of 28 positions shown · non-contrast
Comparison: none

[Series 1: us mfm ob follow-up · 13 of 106 slices shown]
[im 4/106]
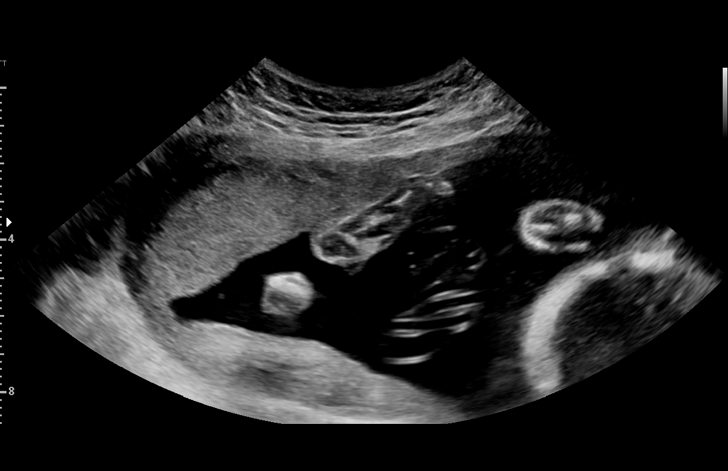
[im 12/106]
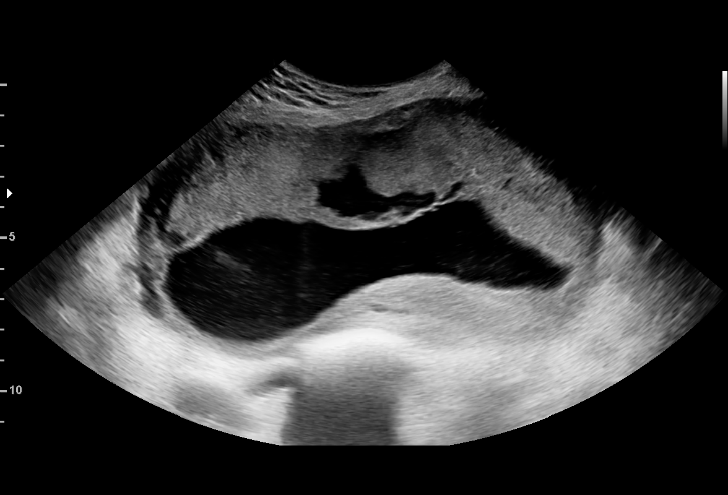
[im 20/106]
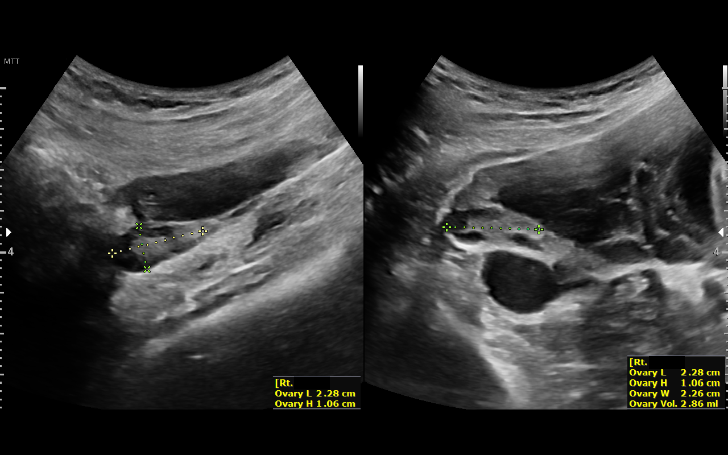
[im 28/106]
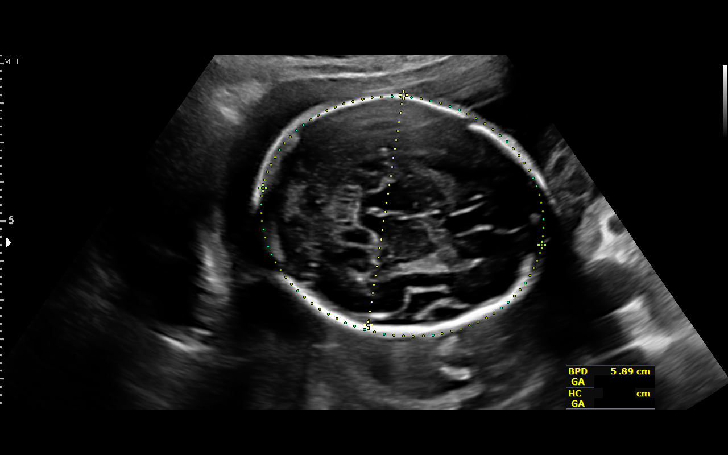
[im 36/106]
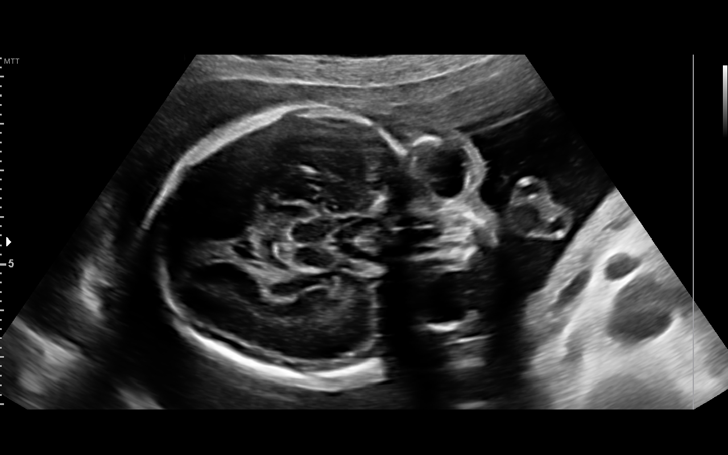
[im 43/106]
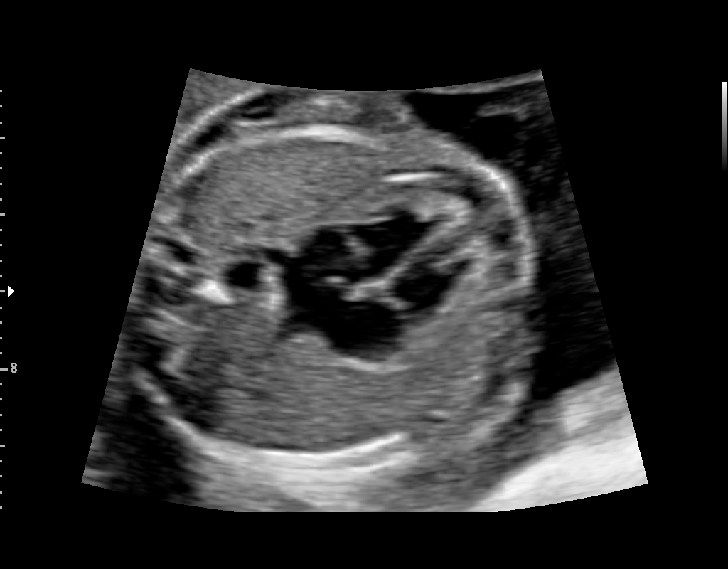
[im 55/106]
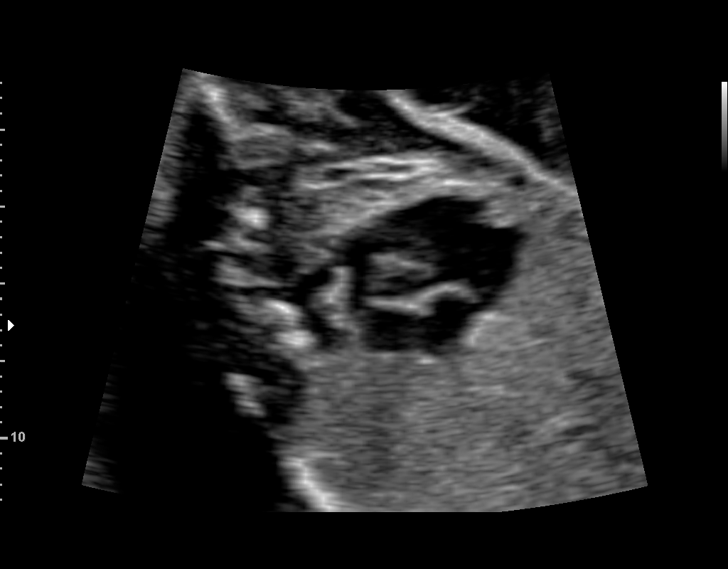
[im 63/106]
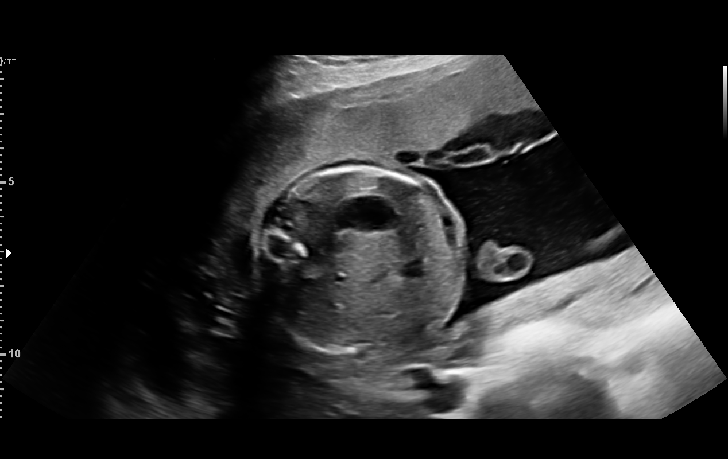
[im 71/106]
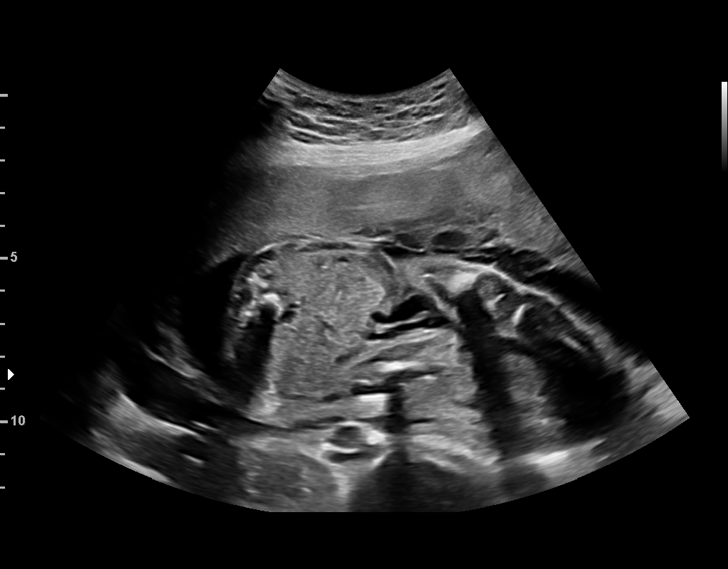
[im 78/106]
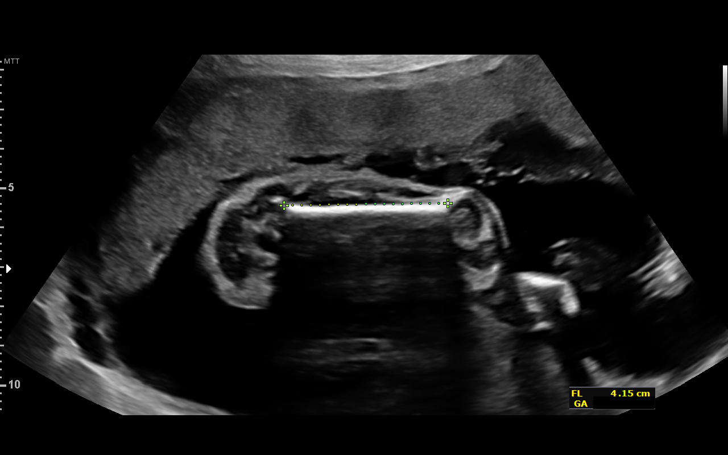
[im 86/106]
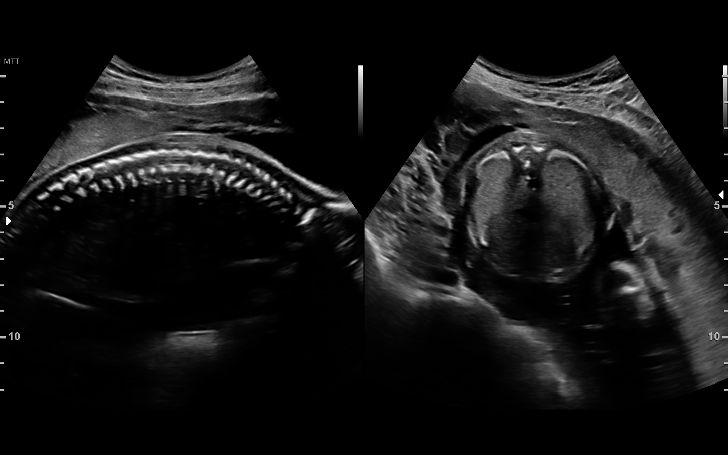
[im 94/106]
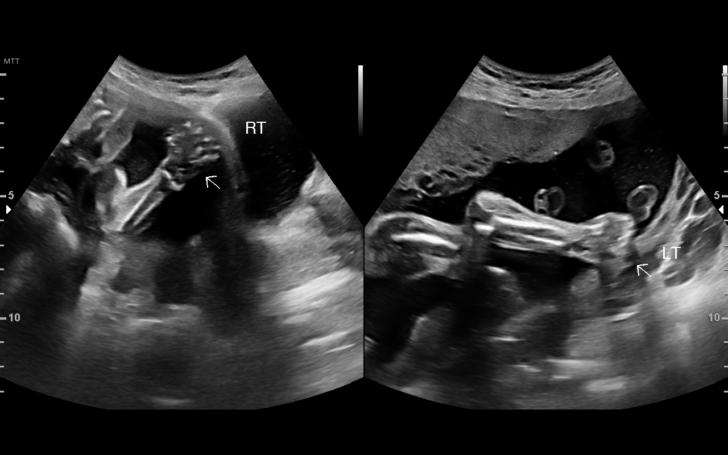
[im 102/106]
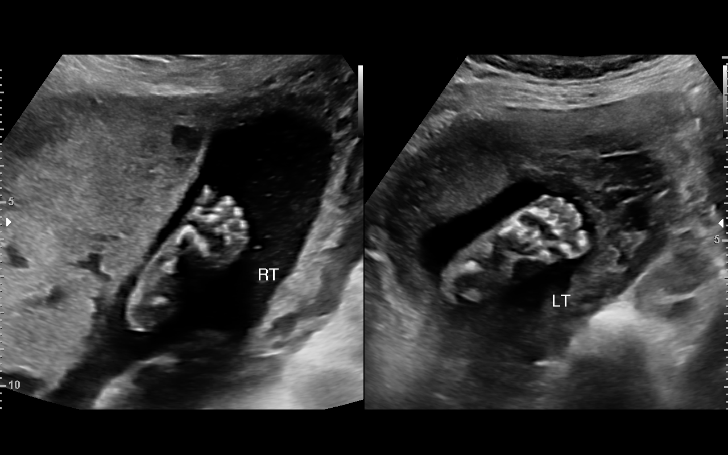

[13 of 28 positions shown; findings below may reference images not displayed]

OB/Gyn Clinic

1  CALEB AUJLA           [id number]      [id number]     [id number]
Indications

23 weeks gestation of pregnancy
Encounter for other antenatal screening
follow-up
Rh negative state in antepartum
OB History

Gravidity:    3         Term:   1        Prem:   0        SAB:   0
TOP:          1       Ectopic:  0        Living: 1
Fetal Evaluation

Num Of Fetuses:     1
Fetal Heart         151
Rate(bpm):
Cardiac Activity:   Observed
Presentation:       Cephalic
Placenta:           Anterior, above cervical os
P. Cord Insertion:  Visualized, central

Amniotic Fluid
AFI FV:      Subjectively within normal limits

Largest Pocket(cm)
5.1
Biometry
BPD:      58.7  mm     G. Age:  24w 0d         77  %    CI:        79.08   %   70 - 86
FL/HC:      19.7   %   19.2 -
HC:      208.7  mm     G. Age:  23w 0d         28  %    HC/AC:      1.07       1.05 -
AC:      194.4  mm     G. Age:  24w 1d         73  %    FL/BPD:     70.2   %   71 - 87
FL:       41.2  mm     G. Age:  23w 3d         46  %    FL/AC:      21.2   %   20 - 24
HUM:      38.8  mm     G. Age:  23w 6d         56  %
CER:      27.6  mm     G. Age:  24w 6d         85  %

CM:        5.7  mm
Est. FW:     623  gm      1 lb 6 oz     62  %
Gestational Age

LMP:           23w 1d       Date:   11/05/16                 EDD:   08/12/17
U/S Today:     23w 5d                                        EDD:   08/08/17
Best:          23w 1d    Det. By:   LMP  (11/05/16)          EDD:   08/12/17
Anatomy

Cranium:               Appears normal         Aortic Arch:            Appears normal
Cavum:                 Appears normal         Ductal Arch:            Appears normal
Ventricles:            Appears normal         Diaphragm:              Appears normal
Choroid Plexus:        Appears normal         Stomach:                Appears normal, left
sided
Cerebellum:            Appears normal         Abdomen:                Appears normal
Posterior Fossa:       Appears normal         Abdominal Wall:         Appears nml (cord
insert, abd wall)
Nuchal Fold:           Previously seen        Cord Vessels:           Appears normal (3
vessel cord)
Face:                  Orbits and profile     Kidneys:                Appear normal
previously seen
Lips:                  Previously seen        Bladder:                Appears normal
Thoracic:              Appears normal         Spine:                  Appears normal
Heart:                 Appears normal         Upper Extremities:      Appears normal
(4CH, axis, and situs
RVOT:                  Appears normal         Lower Extremities:      Appears normal
LVOT:                  Appears normal

Other:  Fetus appears to be a male. Heels previously seen. 5th digit
visualized.
Cervix Uterus Adnexa

Cervix
Length:           3.61  cm.
Normal appearance by transabdominal scan.

Uterus
No abnormality visualized.

Left Ovary
Size(cm)     2.21  x    2.27   x  1.08      Vol(ml):
Within normal limits. No adnexal mass visualized.

Right Ovary
Size(cm)     2.28  x    2.26   x  1.06      Vol(ml):
Within normal limits. No adnexal mass visualized.

Cul De Sac:   No free fluid seen.

Adnexa:       No abnormality visualized.
Impression

Singleton intrauterine pregnancy at 23 weeks 1 day gestation
with fetal cardiac activity
Cephalic presentation
Anterior placenta without evidence of previa
Normal appearing fetal growth and amniotic fluid volume
Fetal spine and anterior abdominal wall appear within normal
limits
Normal appearing cervical length
Recommendations

Follow-up ultrasounds as clinically indicated.

## 2019-08-03 ENCOUNTER — Encounter (HOSPITAL_COMMUNITY): Payer: Self-pay

## 2020-02-25 ENCOUNTER — Other Ambulatory Visit: Payer: Self-pay

## 2020-02-25 ENCOUNTER — Encounter (HOSPITAL_COMMUNITY): Payer: Self-pay | Admitting: Family Medicine

## 2020-02-25 ENCOUNTER — Ambulatory Visit (HOSPITAL_COMMUNITY)
Admission: EM | Admit: 2020-02-25 | Discharge: 2020-02-25 | Disposition: A | Discharge status: Home / Self Care | Payer: Self-pay | Attending: Family Medicine | Admitting: Family Medicine

## 2020-02-25 DIAGNOSIS — N760 Acute vaginitis: Secondary | ICD-10-CM

## 2020-02-25 MED ORDER — METRONIDAZOLE 500 MG PO TABS: 500.0000 mg | ORAL_TABLET | Freq: Two times a day (BID) | ORAL | 0 refills | Status: DC

## 2020-02-25 MED ORDER — FLUCONAZOLE 150 MG PO TABS: 150.0000 mg | ORAL_TABLET | Freq: Once | ORAL | 0 refills | Status: AC

## 2020-02-25 NOTE — ED Provider Notes (Signed)
[place]    CSN: [id number] Arrival date & time: 02/25/20  1546      History   Chief Complaint Chief Complaint  Patient presents with  . Vaginal Irritation    HPI Cindy Ware is a 24 y.o. female.   This is a 24 year old woman who is making her initial visit to [facility] urgent care.  She is complaining about vaginitis and thinks she has bacterial vaginitis.  Pt has vaginal irritation since yesterday; pt states she got her vulva pierced yesterday but had the irritation before then.  LMP March 20th.  Sexually active but uses condoms.  G2P2  Patient says vaginal d/c has fishy odor like BV in past which she's had quite often.     Past Medical History:  Diagnosis Date  . Medical history non-contributory     Patient Active Problem List   Diagnosis Date Noted  . Spontaneous vaginal delivery 08/09/2017  . Supervision of low-risk pregnancy 01/29/2017    Past Surgical History:  Procedure Laterality Date  . THERAPEUTIC ABORTION      OB History    Gravida  4   Para  2   Term  2   Preterm  0   AB  1   Living  2     SAB  0   TAB  1   Ectopic  0   Multiple  0   Live Births  2            Home Medications    Prior to Admission medications   Medication Sig Start Date End Date Taking? Authorizing Provider  fluconazole (DIFLUCAN) 150 MG tablet Take 1 tablet (150 mg total) by mouth once for 1 dose. Repeat if needed 02/25/20 02/25/20  Robyn Haber, MD  metroNIDAZOLE (FLAGYL) 500 MG tablet Take 1 tablet (500 mg total) by mouth 2 (two) times daily. 02/25/20   Robyn Haber, MD    Family History Family History  Problem Relation Age of Onset  . Hypertension Mother   . Asthma Maternal Uncle     Social History Social History   Tobacco Use  . Smoking status: Never Smoker  . Smokeless tobacco: Never Used  Substance Use Topics  . Alcohol use: No  . Drug use: No     Allergies   Patient has no known allergies.   Review of  Systems Review of Systems   Physical Exam Triage Vital Signs ED Triage Vitals  Enc Vitals Group     BP      Pulse      Resp      Temp      Temp src      SpO2      Weight      Height      Head Circumference      Peak Flow      Pain Score      Pain Loc      Pain Edu?      Excl. in [place]?    No data found.  Updated Vital Signs BP 117/76 (BP Location: Right Arm)   Pulse 83   Temp 98.5 F (36.9 C) (Oral)   Resp 18   LMP 02/11/2020   SpO2 95%    Physical Exam Vitals and nursing note reviewed.  Constitutional:      Appearance: Normal appearance. She is normal weight.  HENT:     Head: Normocephalic.  Eyes:     Conjunctiva/sclera: Conjunctivae normal.  Cardiovascular:  Rate and Rhythm: Normal rate.  Pulmonary:     Effort: Pulmonary effort is normal.  Musculoskeletal:        General: Normal range of motion.     Cervical back: Normal range of motion and neck supple.  Skin:    General: Skin is warm and dry.  Neurological:     General: No focal deficit present.     Mental Status: She is alert and oriented to person, place, and time.  Psychiatric:        Mood and Affect: Mood normal.        Behavior: Behavior normal.        Thought Content: Thought content normal.        Judgment: Judgment normal.      UC Treatments / Results  Labs (all labs ordered are listed, but only abnormal results are displayed) Labs Reviewed - No data to display  EKG   Radiology No results found.  Procedures Procedures (including critical care time)  Medications Ordered in UC Medications - No data to display  Initial Impression / Assessment and Plan / UC Course  I have reviewed the triage vital signs and the nursing notes.  Pertinent labs & imaging results that were available during my care of the patient were reviewed by me and considered in my medical decision making (see chart for details).    Final Clinical Impressions(s) / UC Diagnoses   Final diagnoses:    Vaginitis and vulvovaginitis   Discharge Instructions   None    ED Prescriptions    Medication Sig Dispense Auth. Provider   metroNIDAZOLE (FLAGYL) 500 MG tablet Take 1 tablet (500 mg total) by mouth 2 (two) times daily. 14 tablet Elvina Sidle, MD   fluconazole (DIFLUCAN) 150 MG tablet Take 1 tablet (150 mg total) by mouth once for 1 dose. Repeat if needed 2 tablet Elvina Sidle, MD     I have reviewed the PDMP during this encounter.   Elvina Sidle, MD 02/25/20 1642

## 2020-02-25 NOTE — ED Triage Notes (Signed)
Pt has vaginal irritation since yesterday; pt states she got her vagina pierced yesterday but had the irritation before then.

## 2020-10-23 ENCOUNTER — Other Ambulatory Visit: Payer: Self-pay

## 2020-10-23 ENCOUNTER — Encounter (HOSPITAL_BASED_OUTPATIENT_CLINIC_OR_DEPARTMENT_OTHER): Payer: Self-pay | Admitting: Emergency Medicine

## 2020-10-23 ENCOUNTER — Emergency Department (HOSPITAL_BASED_OUTPATIENT_CLINIC_OR_DEPARTMENT_OTHER)
Admission: EM | Admit: 2020-10-23 | Discharge: 2020-10-23 | Disposition: A | Discharge status: Home / Self Care | Payer: HRSA Program | Attending: Emergency Medicine | Admitting: Emergency Medicine

## 2020-10-23 ENCOUNTER — Emergency Department (HOSPITAL_BASED_OUTPATIENT_CLINIC_OR_DEPARTMENT_OTHER): Payer: HRSA Program

## 2020-10-23 DIAGNOSIS — R072 Precordial pain: Secondary | ICD-10-CM | POA: Diagnosis present

## 2020-10-23 DIAGNOSIS — U071 COVID-19: Secondary | ICD-10-CM | POA: Insufficient documentation

## 2020-10-23 NOTE — ED Notes (Signed)
ED Provider at bedside. 

## 2020-10-23 NOTE — ED Notes (Signed)
Patient denies pain and is resting comfortably.  

## 2020-10-23 NOTE — ED Provider Notes (Signed)
[facility] EMERGENCY DEPARTMENT Provider Note   CSN: [id number] Arrival date & time: 10/23/20  1152     History Chief Complaint  Patient presents with  . Chest Pain    Cindy Ware is a 24 y.o. female.  Patient is a 24 year old female with no significant past medical history.  She presents today for evaluation of chest discomfort.  Patient was diagnosed with Covid 4 days ago.  Since then she describes feeling short of breath and discomfort in her chest when she takes a deep breath.  She denies leg swelling or calf pain.  The history is provided by the patient.  Chest Pain Pain location:  Substernal area Pain quality: tightness   Pain radiates to:  Does not radiate Timing:  Constant Progression:  Worsening Chronicity:  New Context: breathing   Relieved by:  Nothing Worsened by:  Nothing Ineffective treatments:  None tried      Past Medical History:  Diagnosis Date  . Medical history non-contributory     Patient Active Problem List   Diagnosis Date Noted  . Spontaneous vaginal delivery 08/09/2017  . Supervision of low-risk pregnancy 01/29/2017    Past Surgical History:  Procedure Laterality Date  . THERAPEUTIC ABORTION       OB History    Gravida  4   Para  2   Term  2   Preterm  0   AB  1   Living  2     SAB  0   TAB  1   Ectopic  0   Multiple  0   Live Births  2           Family History  Problem Relation Age of Onset  . Hypertension Mother   . Asthma Maternal Uncle     Social History   Tobacco Use  . Smoking status: Never Smoker  . Smokeless tobacco: Never Used  Substance Use Topics  . Alcohol use: No  . Drug use: No    Home Medications Prior to Admission medications   Medication Sig Start Date End Date Taking? Authorizing Provider  metroNIDAZOLE (FLAGYL) 500 MG tablet Take 1 tablet (500 mg total) by mouth 2 (two) times daily. 02/25/20   Elvina Sidle, MD    Allergies    Patient has no known  allergies.  Review of Systems   Review of Systems  Cardiovascular: Positive for chest pain.  All other systems reviewed and are negative.   Physical Exam Updated Vital Signs BP (!) 124/91   Pulse 99   Temp 98.5 F (36.9 C) (Oral)   Resp 16   Ht 5\' 3"  (1.6 m)   Wt 50.8 kg   SpO2 99%   BMI 19.84 kg/m   Physical Exam Vitals and nursing note reviewed.  Constitutional:      General: She is not in acute distress.    Appearance: She is well-developed. She is not diaphoretic.  HENT:     Head: Normocephalic and atraumatic.  Cardiovascular:     Rate and Rhythm: Normal rate and regular rhythm.     Heart sounds: No murmur heard.  No friction rub. No gallop.   Pulmonary:     Effort: Pulmonary effort is normal. No respiratory distress.     Breath sounds: Normal breath sounds. No wheezing.  Abdominal:     General: Bowel sounds are normal. There is no distension.     Palpations: Abdomen is soft.     Tenderness: There is no  abdominal tenderness.  Musculoskeletal:        General: Normal range of motion.     Cervical back: Normal range of motion and neck supple.     Right lower leg: No tenderness. No edema.     Left lower leg: No tenderness. No edema.  Skin:    General: Skin is warm and dry.  Neurological:     Mental Status: She is alert and oriented to person, place, and time.     ED Results / Procedures / Treatments   Labs (all labs ordered are listed, but only abnormal results are displayed) Labs Reviewed - No data to display  EKG EKG Interpretation  Date/Time:  Tuesday October 23 2020 11:58:21 EST Ventricular Rate:  89 PR Interval:  116 QRS Duration: 82 QT Interval:  360 QTC Calculation: 438 R Axis:   82 Text Interpretation: Normal sinus rhythm Normal ECG Confirmed by Geoffery Lyons ([id number]) on 10/23/2020 12:32:29 PM   Radiology No results found.  Procedures Procedures (including critical care time)  Medications Ordered in ED Medications - No data to  display  ED Course  I have reviewed the triage vital signs and the nursing notes.  Pertinent labs & imaging results that were available during my care of the patient were reviewed by me and considered in my medical decision making (see chart for details).    MDM Rules/Calculators/A&P  Patient with recent diagnosis of COVID-19 presenting with complaints of chest discomfort.  Patient's vitals are stable with no tachycardia and stable vital signs.  Chest x-ray does show changes consistent with COVID-19 but no other acute process.  I highly doubt pulmonary embolism.  There is no tachycardia, no hypoxia, no tachypnea.  At this point, I feel as though discharge is appropriate with continued symptomatic care and as needed return.  Final Clinical Impression(s) / ED Diagnoses Final diagnoses:  None    Rx / DC Orders ED Discharge Orders    None       Geoffery Lyons, MD 10/23/20 1405

## 2020-10-23 NOTE — ED Triage Notes (Signed)
Pt c/o intermittent chest pain since this am.  Pt states she has covid.  She has been coughing a lot.  Pt started having her symptoms on the 21st.

## 2020-10-23 NOTE — Discharge Instructions (Addendum)
Take Tylenol 650 mg rotated with ibuprofen 400 mg every 3 hours as needed for pain or fever.  Drink plenty of fluids and get plenty of rest.  Isolate at home until you are symptom-free +1-week.  Return to the ER in the meantime if symptoms significantly worsen or change.

## 2020-12-31 ENCOUNTER — Other Ambulatory Visit: Payer: Self-pay

## 2020-12-31 ENCOUNTER — Ambulatory Visit (HOSPITAL_COMMUNITY): Payer: No Payment, Other | Admitting: Professional

## 2020-12-31 ENCOUNTER — Telehealth (HOSPITAL_COMMUNITY): Payer: Self-pay | Admitting: Professional

## 2020-12-31 NOTE — Telephone Encounter (Signed)
See call logs 

## 2021-01-15 ENCOUNTER — Ambulatory Visit (HOSPITAL_COMMUNITY): Payer: No Payment, Other | Admitting: Professional

## 2021-01-20 ENCOUNTER — Other Ambulatory Visit: Payer: Self-pay

## 2021-01-20 ENCOUNTER — Encounter (HOSPITAL_BASED_OUTPATIENT_CLINIC_OR_DEPARTMENT_OTHER): Payer: Self-pay | Admitting: *Deleted

## 2021-01-20 ENCOUNTER — Emergency Department (HOSPITAL_BASED_OUTPATIENT_CLINIC_OR_DEPARTMENT_OTHER)
Admission: EM | Admit: 2021-01-20 | Discharge: 2021-01-20 | Disposition: A | Discharge status: Home / Self Care | Payer: Self-pay | Attending: Emergency Medicine | Admitting: Emergency Medicine

## 2021-01-20 DIAGNOSIS — N898 Other specified noninflammatory disorders of vagina: Secondary | ICD-10-CM | POA: Insufficient documentation

## 2021-01-20 LAB — WET PREP, GENITAL
Clue Cells Wet Prep HPF POC: NONE SEEN
Sperm: NONE SEEN
Trich, Wet Prep: NONE SEEN
Yeast Wet Prep HPF POC: NONE SEEN

## 2021-01-20 MED ORDER — METRONIDAZOLE 500 MG PO TABS: 500.0000 mg | ORAL_TABLET | Freq: Two times a day (BID) | ORAL | 0 refills | Status: AC

## 2021-01-20 NOTE — ED Provider Notes (Signed)
[facility] EMERGENCY DEPARTMENT Provider Note   CSN: [id number] Arrival date & time: 01/20/21  1847     History Chief Complaint  Patient presents with  . Vaginal Discharge    Cindy Ware is a 25 y.o. female.  The history is provided by the patient. No language interpreter was used.  Vaginal Discharge Quality:  Malodorous, milky and white Severity:  Moderate Onset quality:  Gradual Duration:  3 weeks Timing:  Constant Progression:  Unchanged Chronicity:  Recurrent Context: spontaneously   Relieved by:  None tried Worsened by:  Nothing Ineffective treatments:  None tried Associated symptoms: no abdominal pain, no dyspareunia, no dysuria, no fever, no genital lesions, no nausea, no rash, no urinary frequency, no urinary hesitancy, no urinary incontinence, no vaginal itching and no vomiting   Risk factors: no endometriosis, no foreign body, no gynecological surgery, no immunosuppression, no new sexual partner, no PID, no prior miscarriage, no STI, no STI exposure and no terminated pregnancy        Past Medical History:  Diagnosis Date  . Medical history non-contributory     Patient Active Problem List   Diagnosis Date Noted  . Spontaneous vaginal delivery 08/09/2017  . Supervision of low-risk pregnancy 01/29/2017    Past Surgical History:  Procedure Laterality Date  . THERAPEUTIC ABORTION       OB History    Gravida  4   Para  2   Term  2   Preterm  0   AB  1   Living  2     SAB  0   IAB  1   Ectopic  0   Multiple  0   Live Births  2           Family History  Problem Relation Age of Onset  . Hypertension Mother   . Asthma Maternal Uncle     Social History   Tobacco Use  . Smoking status: Never Smoker  . Smokeless tobacco: Never Used  Substance Use Topics  . Alcohol use: No  . Drug use: No    Home Medications Prior to Admission medications   Medication Sig Start Date End Date Taking? Authorizing Provider   metroNIDAZOLE (FLAGYL) 500 MG tablet Take 1 tablet (500 mg total) by mouth 2 (two) times daily. 02/25/20   Elvina Sidle, MD    Allergies    Patient has no known allergies.  Review of Systems   Review of Systems  Constitutional: Negative for chills and fever.  Gastrointestinal: Negative for abdominal distention, abdominal pain, nausea and vomiting.  Genitourinary: Positive for vaginal discharge. Negative for bladder incontinence, dyspareunia, dysuria and hesitancy.  Skin: Negative for rash and wound.    Physical Exam Updated Vital Signs BP 122/66 (BP Location: Right Arm)   Pulse 83   Temp 98.3 F (36.8 C) (Oral)   Resp 18   Ht 5\' 3"  (1.6 m)   Wt 50.8 kg   LMP 01/05/2021   SpO2 100%   BMI 19.84 kg/m   Physical Exam Vitals and nursing note reviewed.  Constitutional:      General: She is not in acute distress.    Appearance: She is well-developed and well-nourished. She is not diaphoretic.  HENT:     Head: Normocephalic and atraumatic.  Eyes:     General: No scleral icterus.    Conjunctiva/sclera: Conjunctivae normal.  Cardiovascular:     Rate and Rhythm: Normal rate and regular rhythm.     Heart sounds:  Normal heart sounds. No murmur heard. No friction rub. No gallop.   Pulmonary:     Effort: Pulmonary effort is normal. No respiratory distress.     Breath sounds: Normal breath sounds.  Abdominal:     General: Bowel sounds are normal. There is no distension.     Palpations: Abdomen is soft. There is no mass.     Tenderness: There is no abdominal tenderness. There is no guarding.  Musculoskeletal:     Cervical back: Normal range of motion.  Skin:    General: Skin is warm and dry.  Neurological:     Mental Status: She is alert and oriented to person, place, and time.  Psychiatric:        Behavior: Behavior normal.     ED Results / Procedures / Treatments   Labs (all labs ordered are listed, but only abnormal results are displayed) Labs Reviewed - No data  to display  EKG None  Radiology No results found.  Procedures Procedures   Medications Ordered in ED Medications - No data to display  ED Course  I have reviewed the triage vital signs and the nursing notes.  Pertinent labs & imaging results that were available during my care of the patient were reviewed by me and considered in my medical decision making (see chart for details).    MDM Rules/Calculators/A&P                          25 year old female here with foul odor and vaginal discharge, no urinary symptoms.  No pelvic pain.  No fevers.  Patient did self swabs here.  Wet prep returned and shows white blood cells.  She denies concern for STIs however she has a GC chlamydia pending.  Patient treated with Flagyl.  Given discharge instructions on OTC boric acid suppositories for recurrent bacterial vaginosis.  She appears otherwise appropriate for discharge at this time with oral Flagyl, alcohol precautions and return precautions given Final Clinical Impression(s) / ED Diagnoses Final diagnoses:  None    Rx / DC Orders ED Discharge Orders    None       Arthor Captain, PA-C 01/20/21 2105    Jacalyn Lefevre, MD 01/20/21 2205

## 2021-01-20 NOTE — ED Notes (Signed)
Pt. Reports past history of BV and this is the same as in past with " fishy odor".  This started approx. 2 wks ago.  No itching and no burning.

## 2021-01-20 NOTE — ED Triage Notes (Signed)
Malodorous vaginal discharge x 3 weeks. Hx of BV-states that it feels similar. Denies pain.

## 2021-01-20 NOTE — Discharge Instructions (Signed)
You can purchase Boric Acid suppositories at [employer] ( LOVE wellness brand  Killer boric acid suppositories for yeast and odor causing bacteria or Honey Pot or  pH-D brand)  or online. Use 1 suppository inside your vagina before bed for three nights on alternating nights (e.g M/W/F) for 1 week. Then you may use the suppositories once weekly for maintenance. DO NOT TAKE THESE BY MOUTH!,. Wear a liner or pad to bed because you can get fluid leakage from the vagina.  You can do this if you are having recurrent yeast/BV infections.  I am discharging you today with metronidazole. Do  not drink alcohol with this medication.  Contact a health care provider if: Your symptoms do not improve, even after treatment. You have more discharge or pain when urinating. You have a fever or chills. You have pain in your abdomen or pelvis. You have pain during sex. You have vaginal bleeding between menstrual periods.

## 2021-01-21 LAB — GC/CHLAMYDIA PROBE AMP (~~LOC~~) NOT AT ARMC
Chlamydia: NEGATIVE
Comment: NEGATIVE
Comment: NORMAL
Neisseria Gonorrhea: NEGATIVE

## 2021-01-22 ENCOUNTER — Telehealth (HOSPITAL_COMMUNITY): Payer: Self-pay | Admitting: Professional

## 2021-01-22 ENCOUNTER — Other Ambulatory Visit: Payer: Self-pay

## 2021-01-22 ENCOUNTER — Ambulatory Visit (INDEPENDENT_AMBULATORY_CARE_PROVIDER_SITE_OTHER): Payer: No Payment, Other | Admitting: Professional

## 2021-01-22 DIAGNOSIS — F1290 Cannabis use, unspecified, uncomplicated: Secondary | ICD-10-CM | POA: Diagnosis not present

## 2021-01-22 DIAGNOSIS — F332 Major depressive disorder, recurrent severe without psychotic features: Secondary | ICD-10-CM | POA: Diagnosis not present

## 2021-01-22 DIAGNOSIS — F411 Generalized anxiety disorder: Secondary | ICD-10-CM | POA: Insufficient documentation

## 2021-01-22 NOTE — Telephone Encounter (Signed)
See call log 
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
## 2021-02-05 ENCOUNTER — Telehealth (HOSPITAL_COMMUNITY): Payer: Self-pay | Admitting: Professional

## 2021-02-05 ENCOUNTER — Ambulatory Visit (HOSPITAL_COMMUNITY): Payer: No Payment, Other | Admitting: Professional

## 2021-02-05 ENCOUNTER — Other Ambulatory Visit: Payer: Self-pay

## 2021-02-05 NOTE — Telephone Encounter (Signed)
See call log 

## 2021-02-11 ENCOUNTER — Telehealth (HOSPITAL_COMMUNITY): Payer: No Payment, Other

## 2021-02-11 ENCOUNTER — Other Ambulatory Visit: Payer: Self-pay

## 2021-03-25 ENCOUNTER — Other Ambulatory Visit: Payer: Self-pay

## 2021-03-25 ENCOUNTER — Telehealth (HOSPITAL_COMMUNITY): Payer: No Payment, Other

## 2022-06-21 ENCOUNTER — Emergency Department (HOSPITAL_BASED_OUTPATIENT_CLINIC_OR_DEPARTMENT_OTHER): Payer: Self-pay

## 2022-06-21 ENCOUNTER — Other Ambulatory Visit: Payer: Self-pay

## 2022-06-21 ENCOUNTER — Encounter (HOSPITAL_BASED_OUTPATIENT_CLINIC_OR_DEPARTMENT_OTHER): Payer: Self-pay | Admitting: Emergency Medicine

## 2022-06-21 ENCOUNTER — Emergency Department (HOSPITAL_BASED_OUTPATIENT_CLINIC_OR_DEPARTMENT_OTHER)
Admission: EM | Admit: 2022-06-21 | Discharge: 2022-06-21 | Disposition: A | Discharge status: Home / Self Care | Payer: Self-pay | Attending: Emergency Medicine | Admitting: Emergency Medicine

## 2022-06-21 DIAGNOSIS — M25562 Pain in left knee: Secondary | ICD-10-CM | POA: Insufficient documentation

## 2022-06-21 DIAGNOSIS — W010XXA Fall on same level from slipping, tripping and stumbling without subsequent striking against object, initial encounter: Secondary | ICD-10-CM | POA: Insufficient documentation

## 2022-06-21 MED ORDER — IBUPROFEN 400 MG PO TABS
600.0000 mg | ORAL_TABLET | Freq: Once | ORAL | Status: AC
  Administered 2022-06-21: 600 mg via ORAL
  Filled 2022-06-21: qty 1

## 2022-06-21 MED ORDER — IBUPROFEN 600 MG PO TABS: 600.0000 mg | ORAL_TABLET | Freq: Four times a day (QID) | ORAL | 0 refills | Status: AC | PRN

## 2022-06-21 NOTE — Discharge Instructions (Addendum)
I attached an orthopedic office for you to follow-up with in a couple of weeks if you are still having trouble with your knee.  In the meantime continue to ice and elevate your knee.  You may take ibuprofen for any swelling and pain.  I have sent some of this to your pharmacy on file.  Return with any decrease sensation, limitations in the range of motion or any worsening symptoms.  It was a pleasure to meet you and I hope that you feel better.

## 2022-06-21 NOTE — ED Provider Notes (Signed)
[facility] EMERGENCY DEPARTMENT Provider Note   CSN: [id number] Arrival date & time: 06/21/22  1704     History  Chief Complaint  Patient presents with   Knee Pain    Cindy Ware is a 26 y.o. female presenting with left knee pain.  Reports yesterday she tripped and fell onto her knee.  She says it was "so swollen that my kneecap was on the left side of my leg."  She iced and elevated it which brought the swelling down.  Still is having pain along the medial aspect of the left knee.    Knee Pain      Home Medications Prior to Admission medications   Medication Sig Start Date End Date Taking? Authorizing Provider  metroNIDAZOLE (FLAGYL) 500 MG tablet Take 1 tablet (500 mg total) by mouth 2 (two) times daily. One po bid x 7 days 01/20/21   Arthor Captain, PA-C      Allergies    Patient has no known allergies.    Review of Systems   Review of Systems  Physical Exam Updated Vital Signs BP 113/87 (BP Location: Left Arm)   Pulse 86   Temp 98.5 F (36.9 C) (Oral)   Resp 16   Ht 5\' 3"  (1.6 m)   Wt 54.4 kg   LMP 06/01/2022 (Approximate)   SpO2 99%   BMI 21.26 kg/m  Physical Exam Vitals and nursing note reviewed.  Constitutional:      Appearance: Normal appearance.  HENT:     Head: Normocephalic and atraumatic.  Eyes:     General: No scleral icterus.    Conjunctiva/sclera: Conjunctivae normal.  Cardiovascular:     Pulses: Normal pulses.  Pulmonary:     Effort: Pulmonary effort is normal. No respiratory distress.  Musculoskeletal:        General: Tenderness present. No swelling. Normal range of motion.  Skin:    General: Skin is warm and dry.     Findings: No rash.  Neurological:     Mental Status: She is alert.  Psychiatric:        Mood and Affect: Mood normal.     ED Results / Procedures / Treatments   Labs (all labs ordered are listed, but only abnormal results are displayed) Labs Reviewed - No data to  display  EKG None  Radiology No results found.  Procedures Procedures   Medications Ordered in ED Medications  ibuprofen (ADVIL) tablet 600 mg (600 mg Oral Given 06/21/22 1720)    ED Course/ Medical Decision Making/ A&P                           Medical Decision Making Amount and/or Complexity of Data Reviewed Radiology: ordered.  Risk Prescription drug management.   26 year old female presenting today with left knee pain.  Reports this was started by falling down.  She said her knee was very swollen but it came down with elevation and icing.  She has had issues with this knee in the past and seen an orthopedic doctor who said that nothing was broken or wrong.  Physical exam: Full range of motion of the knee.  Negative anterior/posterior drawer.  Negative varus/valgus.  Strong DP pulse  Imaging: X-ray reviewed, and interpreted by me.  I agree with the radiologist that it is negative  Treatment: Given ibuprofen  MDM/disposition: Patient's x-ray is negative today.  She is neurovascularly intact with full range of motion of the knee.  She is ambulatory.  She may follow-up with an outpatient provider as needed however at this point I believe her symptoms will resolve with elevation, ice, NSAIDs and compression.  Final Clinical Impression(s) / ED Diagnoses Final diagnoses:  Acute pain of left knee    Rx / DC Orders ED Discharge Orders          Ordered    ibuprofen (ADVIL) 600 MG tablet  Every 6 hours PRN        06/21/22 1716           Results and diagnoses were explained to the patient. Return precautions discussed in full. Patient had no additional questions and expressed complete understanding.   This chart was dictated using voice recognition software.  Despite best efforts to proofread,  errors can occur which can change the documentation meaning.    Saddie Benders, PA-C 06/21/22 1746    Sloan Leiter, DO 06/24/22 [phone]

## 2022-06-21 NOTE — ED Triage Notes (Signed)
Patient states her left knee popped out last Friday and she has had pain and swelling since then.

## 2022-07-14 ENCOUNTER — Ambulatory Visit: Payer: Self-pay | Admitting: Family Medicine

## 2022-07-26 IMAGING — DX DG CHEST 1V PORT
1 series · 1 of 1 positions shown · non-contrast
Comparison: None.

CLINICAL DATA: Cough and chest pain, COVID positive [DATE]

EXAM:
PORTABLE CHEST 1 VIEW

[chest ap]
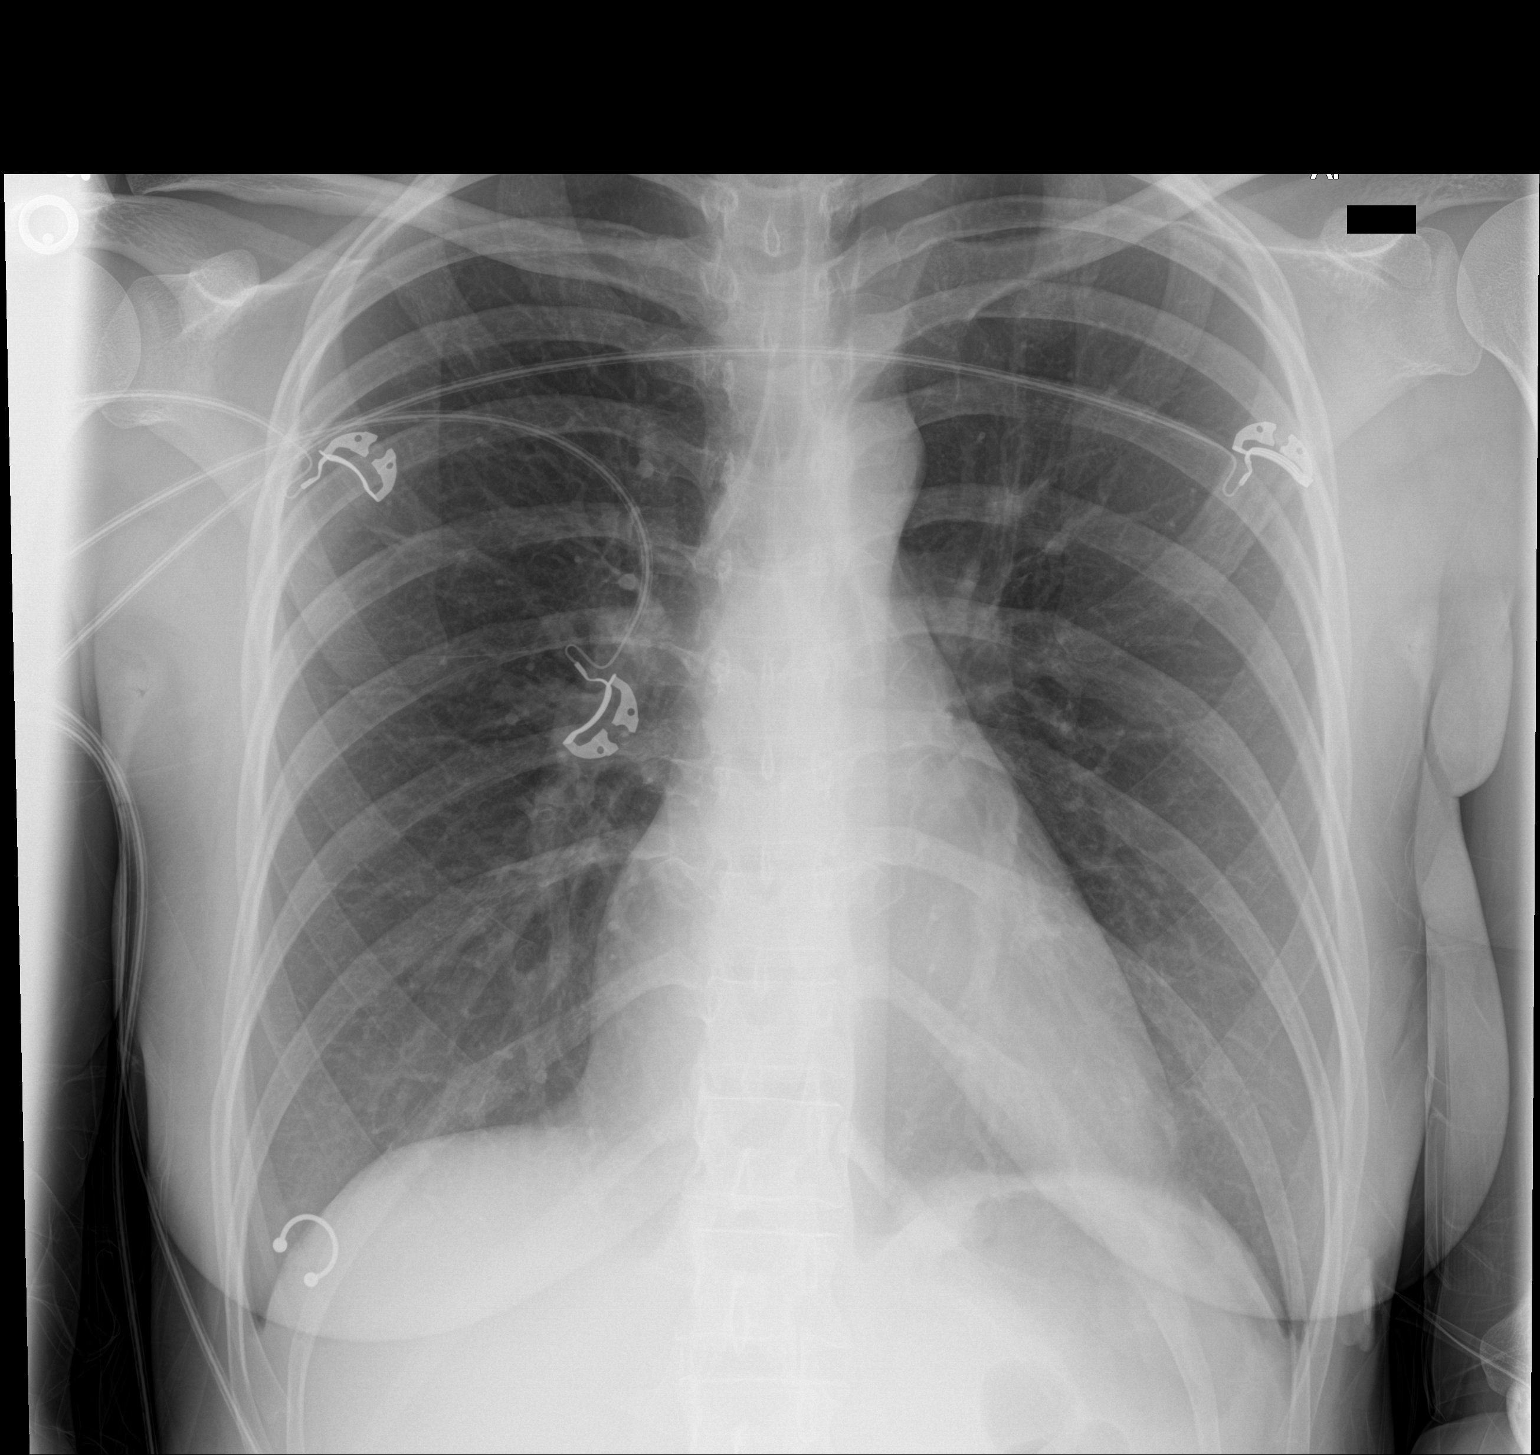

[1 of 1 positions shown; findings below may reference images not displayed]

FINDINGS: The heart size and mediastinal contours are within normal limits.
Mild perihilar peribronchial thickening, with a hazy right lower
lobe opacity. No visible pneumothorax or pleural effusion. The
visualized skeletal structures are unremarkable.
IMPRESSION: Mild perihilar peribronchial thickening, with a hazy right lower
lobe opacity which can be seen with atypical infection such as
[id number].

## 2022-09-03 ENCOUNTER — Encounter (HOSPITAL_BASED_OUTPATIENT_CLINIC_OR_DEPARTMENT_OTHER): Payer: Self-pay | Admitting: Emergency Medicine

## 2022-09-03 ENCOUNTER — Other Ambulatory Visit: Payer: Self-pay

## 2022-09-03 ENCOUNTER — Emergency Department (HOSPITAL_BASED_OUTPATIENT_CLINIC_OR_DEPARTMENT_OTHER): Payer: Self-pay

## 2022-09-03 ENCOUNTER — Emergency Department (HOSPITAL_BASED_OUTPATIENT_CLINIC_OR_DEPARTMENT_OTHER)
Admission: EM | Admit: 2022-09-03 | Discharge: 2022-09-03 | Disposition: A | Discharge status: Home / Self Care | Payer: Self-pay | Attending: Emergency Medicine | Admitting: Emergency Medicine

## 2022-09-03 DIAGNOSIS — O209 Hemorrhage in early pregnancy, unspecified: Secondary | ICD-10-CM | POA: Insufficient documentation

## 2022-09-03 DIAGNOSIS — N3001 Acute cystitis with hematuria: Secondary | ICD-10-CM

## 2022-09-03 DIAGNOSIS — O4690 Antepartum hemorrhage, unspecified, unspecified trimester: Secondary | ICD-10-CM

## 2022-09-03 DIAGNOSIS — O2311 Infections of bladder in pregnancy, first trimester: Secondary | ICD-10-CM | POA: Insufficient documentation

## 2022-09-03 DIAGNOSIS — Z3A01 Less than 8 weeks gestation of pregnancy: Secondary | ICD-10-CM | POA: Insufficient documentation

## 2022-09-03 LAB — CBC WITH DIFFERENTIAL/PLATELET
Abs Immature Granulocytes: 0.03 10*3/uL (ref 0.00–0.07)
Basophils Absolute: 0.1 10*3/uL (ref 0.0–0.1)
Basophils Relative: 1 %
Eosinophils Absolute: 0.2 10*3/uL (ref 0.0–0.5)
Eosinophils Relative: 2 %
HCT: 34.1 % — ABNORMAL LOW (ref 36.0–46.0)
Hemoglobin: 11.9 g/dL — ABNORMAL LOW (ref 12.0–15.0)
Immature Granulocytes: 0 %
Lymphocytes Relative: 30 %
Lymphs Abs: 3.1 10*3/uL (ref 0.7–4.0)
MCH: 31.8 pg (ref 26.0–34.0)
MCHC: 34.9 g/dL (ref 30.0–36.0)
MCV: 91.2 fL (ref 80.0–100.0)
Monocytes Absolute: 0.7 10*3/uL (ref 0.1–1.0)
Monocytes Relative: 7 %
Neutro Abs: 6.4 10*3/uL (ref 1.7–7.7)
Neutrophils Relative %: 60 %
Platelets: 296 10*3/uL (ref 150–400)
RBC: 3.74 MIL/uL — ABNORMAL LOW (ref 3.87–5.11)
RDW: 11.9 % (ref 11.5–15.5)
WBC: 10.6 10*3/uL — ABNORMAL HIGH (ref 4.0–10.5)
nRBC: 0 % (ref 0.0–0.2)

## 2022-09-03 LAB — URINALYSIS, ROUTINE W REFLEX MICROSCOPIC
Bilirubin Urine: NEGATIVE
Glucose, UA: NEGATIVE mg/dL
Hgb urine dipstick: NEGATIVE
Ketones, ur: NEGATIVE mg/dL
Leukocytes,Ua: NEGATIVE
Nitrite: POSITIVE — AB
Protein, ur: NEGATIVE mg/dL
Specific Gravity, Urine: 1.025 (ref 1.005–1.030)
pH: 5.5 (ref 5.0–8.0)

## 2022-09-03 LAB — PREGNANCY, URINE: Preg Test, Ur: POSITIVE — AB

## 2022-09-03 LAB — COMPREHENSIVE METABOLIC PANEL
ALT: 10 U/L (ref 0–44)
AST: 16 U/L (ref 15–41)
Albumin: 3.7 g/dL (ref 3.5–5.0)
Alkaline Phosphatase: 57 U/L (ref 38–126)
Anion gap: 6 (ref 5–15)
BUN: 9 mg/dL (ref 6–20)
CO2: 24 mmol/L (ref 22–32)
Calcium: 9.2 mg/dL (ref 8.9–10.3)
Chloride: 105 mmol/L (ref 98–111)
Creatinine, Ser: 0.47 mg/dL (ref 0.44–1.00)
GFR, Estimated: >60 mL/min (ref >60)
Glucose, Bld: 94 mg/dL (ref 70–99)
Potassium: 3.3 mmol/L — ABNORMAL LOW (ref 3.5–5.1)
Sodium: 135 mmol/L (ref 135–145)
Total Bilirubin: 0.9 mg/dL (ref 0.3–1.2)
Total Protein: 7.5 g/dL (ref 6.5–8.1)

## 2022-09-03 LAB — URINALYSIS, MICROSCOPIC (REFLEX)

## 2022-09-03 LAB — HCG, QUANTITATIVE, PREGNANCY: hCG, Beta Chain, Quant, S: 102668 m[IU]/mL — ABNORMAL HIGH (ref <5)

## 2022-09-03 LAB — LIPASE, BLOOD: Lipase: 28 U/L (ref 11–51)

## 2022-09-03 MED ORDER — ACETAMINOPHEN 325 MG PO TABS
650.0000 mg | ORAL_TABLET | Freq: Once | ORAL | Status: AC
  Administered 2022-09-03: 650 mg via ORAL
  Filled 2022-09-03: qty 2

## 2022-09-03 MED ORDER — POTASSIUM CHLORIDE CRYS ER 20 MEQ PO TBCR
20.0000 meq | EXTENDED_RELEASE_TABLET | Freq: Once | ORAL | Status: AC
  Administered 2022-09-03: 20 meq via ORAL
  Filled 2022-09-03: qty 1

## 2022-09-03 MED ORDER — RHO D IMMUNE GLOBULIN 1500 UNIT/2ML IJ SOSY
300.0000 ug | PREFILLED_SYRINGE | Freq: Once | INTRAMUSCULAR | Status: AC
  Administered 2022-09-03: 300 ug via INTRAMUSCULAR
  Filled 2022-09-03: qty 2

## 2022-09-03 MED ORDER — DOXYLAMINE-PYRIDOXINE 10-10 MG PO TBEC: 10.0000 mg | DELAYED_RELEASE_TABLET | Freq: Every day | ORAL | 0 refills | Status: AC | PRN

## 2022-09-03 MED ORDER — NITROFURANTOIN MONOHYD MACRO 100 MG PO CAPS: 100.0000 mg | ORAL_CAPSULE | Freq: Two times a day (BID) | ORAL | 0 refills | Status: AC

## 2022-09-03 NOTE — ED Triage Notes (Signed)
Vaginal spotting and abdominal cramping since last night. Pt states she is pregnant, but unsure of how far along she is. States she was lifting heavy at work last night. Reports hx of miscarriage 4 years ago.

## 2022-09-03 NOTE — Discharge Instructions (Addendum)
You were seen in the ER for evaluation of you abdominal pain and vaginal bleeding. You are pregnancy with twins. There is a small subchroinic bleed. You will need to follow up with an OB/GYN soon for reevaluation.  It also showed that you have a urinary tract infection, for this you will be taking an antibiotic twice daily for the next 5 days.  If you are saturating pads, more than 1 an hour for 2 hours, please return to the nearest emergency department for reevaluation.  Contact a health care provider if: You have any vaginal bleeding. You have a fever. Get help right away if: You have severe cramps in your stomach, back, abdomen, or pelvis. You pass large clots or tissue. Save any tissue for your health care provider to look at. You faint. You become light-headed or weak.

## 2022-09-03 NOTE — ED Provider Notes (Signed)
[facility] Provider Note   CSN: [id number] Arrival date & time: 09/03/22  1325     History Chief Complaint  Patient presents with   Abdominal Pain   Vaginal Bleeding    Cindy Ware is a 26 y.o. female G4P2 unknown gestation presents to the ED for evaluation of lower abdominal cramping and some vaginal spotting since yesterday. She reports that she was lifting multiple 50 pound potato sacks at work yesterday and today. She reports that she is spotting a "reddish pink blood", but is not saturating pads, only enough for a pantyliner. She reports that she has had more nausea with this pregnancy, but denies any vomiting. She reports her LMP was 07/20/22 approximately. She reports she had a positive test at home last week. She denies any medical or surgical history. Denies any daily medications or allergies to medications.    Abdominal Pain Associated symptoms: nausea and vaginal bleeding   Associated symptoms: no chest pain, no chills, no constipation, no diarrhea, no dysuria, no fever, no shortness of breath and no vomiting   Vaginal Bleeding Associated symptoms: abdominal pain and nausea   Associated symptoms: no dysuria and no fever        Home Medications Prior to Admission medications   Medication Sig Start Date End Date Taking? Authorizing Provider  ibuprofen (ADVIL) 600 MG tablet Take 1 tablet (600 mg total) by mouth every 6 (six) hours as needed. 06/21/22   Redwine, Madison A, PA-C  metroNIDAZOLE (FLAGYL) 500 MG tablet Take 1 tablet (500 mg total) by mouth 2 (two) times daily. One po bid x 7 days 01/20/21   Margarita Mail, PA-C      Allergies    Patient has no known allergies.    Review of Systems   Review of Systems  Constitutional:  Negative for chills and fever.  Respiratory:  Negative for shortness of breath.   Cardiovascular:  Negative for chest pain.  Gastrointestinal:  Positive for abdominal pain and nausea. Negative for  blood in stool, constipation, diarrhea and vomiting.  Genitourinary:  Positive for vaginal bleeding. Negative for dysuria and pelvic pain.    Physical Exam Updated Vital Signs BP 116/66   Pulse 80   Temp 98.4 F (36.9 C) (Oral)   Resp 17   Ht 5\' 3"  (1.6 m)   Wt 54.4 kg   LMP 07/15/2022 (Approximate)   SpO2 100%   BMI 21.26 kg/m  Physical Exam Vitals and nursing note reviewed.  Constitutional:      General: She is not in acute distress.    Appearance: Normal appearance. She is not ill-appearing or toxic-appearing.  HENT:     Head: Normocephalic and atraumatic.  Eyes:     General: No scleral icterus. Cardiovascular:     Rate and Rhythm: Normal rate and regular rhythm.  Pulmonary:     Effort: Pulmonary effort is normal.     Breath sounds: Normal breath sounds.  Abdominal:     General: Bowel sounds are normal. There is no distension.     Palpations: Abdomen is soft.     Tenderness: There is abdominal tenderness.     Comments: Very mild, diffuse lower abdominal tenderness without guarding or rebound, mainly suprapubic tenderness. NBS. Abdomen soft.   Musculoskeletal:        General: No deformity.     Cervical back: Normal range of motion.  Skin:    General: Skin is warm and dry.  Neurological:     General:  No focal deficit present.     Mental Status: She is alert. Mental status is at baseline.     ED Results / Procedures / Treatments   Labs (all labs ordered are listed, but only abnormal results are displayed) Labs Reviewed  PREGNANCY, URINE - Abnormal; Notable for the following components:      Result Value   Preg Test, Ur POSITIVE (*)    All other components within normal limits  COMPREHENSIVE METABOLIC PANEL - Abnormal; Notable for the following components:   Potassium 3.3 (*)    All other components within normal limits  URINALYSIS, ROUTINE W REFLEX MICROSCOPIC - Abnormal; Notable for the following components:   APPearance CLOUDY (*)    Nitrite POSITIVE (*)     All other components within normal limits  CBC WITH DIFFERENTIAL/PLATELET - Abnormal; Notable for the following components:   WBC 10.6 (*)    RBC 3.74 (*)    Hemoglobin 11.9 (*)    HCT 34.1 (*)    All other components within normal limits  HCG, QUANTITATIVE, PREGNANCY - Abnormal; Notable for the following components:   hCG, Beta Chain, Quant, S 102,668 (*)    All other components within normal limits  URINALYSIS, MICROSCOPIC (REFLEX) - Abnormal; Notable for the following components:   Bacteria, UA MANY (*)    All other components within normal limits  LIPASE, BLOOD    EKG None  Radiology US OB Comp AddL Gest Less 14 Wks  Result Date: 09/03/2022 CLINICAL DATA:  Spotting and cramping during first trimester pregnancy. Quantitative beta HCG is 102,668. LMP is not provided. EXAM: TWIN OBSTETRICAL ULTRASOUND <14 WKS TECHNIQUE: Transabdominal ultrasound was performed for evaluation of the gestation as well as the maternal uterus and adnexal regions. COMPARISON:  None Available. FINDINGS: Number of IUPs:  2 Chorionicity/Amnionicity:  Appears to be dichorionic diamniotic. TWIN 1 Yolk sac:  Visualized. Embryo:  Visualized. Cardiac Activity: Visualized. Heart Rate: 128 bpm CRL:   5.5 mm   6 w 2 d                  [place] EDC: 04/27/2023 TWIN 2 Yolk sac:  Visualized. Embryo:  Visualized. Cardiac Activity: Visualized. Heart Rate: 120 bpm CRL:   7.4 mm   6 w 4 d                  [place] EDC: 04/25/2023 Subchorionic hemorrhage: Small subchorionic hemorrhages are identified. Maternal uterus/adnexae: Uterus is anteverted. No myometrial mass lesions are identified. Both ovaries are visualized. Bilateral ovarian cysts with simple appearing cyst in the right ovary measuring 2.5 cm diameter and mildly complex cyst in the left ovary measuring 1.7 cm diameter. Likely corpus luteal cysts. No free fluid. IMPRESSION: Twin intrauterine pregnancy. Estimated gestational age by crown-rump length is 6 weeks 2 days for twin 1 and 6  weeks 4 days for twin 2. Small subchorionic hemorrhages are demonstrated. Electronically Signed   By: Burman Nieves M.D.   On: 09/03/2022 19:58   US OB Comp Less 14 Wks  Result Date: 09/03/2022 CLINICAL DATA:  Spotting and cramping during first trimester pregnancy. Quantitative beta HCG is 102,668. LMP is not provided. EXAM: TWIN OBSTETRICAL ULTRASOUND <14 WKS TECHNIQUE: Transabdominal ultrasound was performed for evaluation of the gestation as well as the maternal uterus and adnexal regions. COMPARISON:  None Available. FINDINGS: Number of IUPs:  2 Chorionicity/Amnionicity:  Appears to be dichorionic diamniotic. TWIN 1 Yolk sac:  Visualized. Embryo:  Visualized. Cardiac Activity: Visualized. Heart Rate:  128 bpm CRL:   5.5 mm   6 w 2 d                  [place] EDC: 04/27/2023 TWIN 2 Yolk sac:  Visualized. Embryo:  Visualized. Cardiac Activity: Visualized. Heart Rate: 120 bpm CRL:   7.4 mm   6 w 4 d                  [place] EDC: 04/25/2023 Subchorionic hemorrhage: Small subchorionic hemorrhages are identified. Maternal uterus/adnexae: Uterus is anteverted. No myometrial mass lesions are identified. Both ovaries are visualized. Bilateral ovarian cysts with simple appearing cyst in the right ovary measuring 2.5 cm diameter and mildly complex cyst in the left ovary measuring 1.7 cm diameter. Likely corpus luteal cysts. No free fluid. IMPRESSION: Twin intrauterine pregnancy. Estimated gestational age by crown-rump length is 6 weeks 2 days for twin 1 and 6 weeks 4 days for twin 2. Small subchorionic hemorrhages are demonstrated. Electronically Signed   By: Burman Nieves M.D.   On: 09/03/2022 19:58     Procedures Procedures   Medications Ordered in ED Medications - No data to display  ED Course/ Medical Decision Making/ A&P Clinical Course as of 09/05/22 1814  Wed Sep 03, 2022  2000 Follow-up on ultrasound, discharged with close follow-up with OB.  Antibiotics for UTI. [CR]    Clinical Course User  Index [CR] Peter Garter, PA                           Medical Decision Making Amount and/or Complexity of Data Reviewed Labs: ordered. Radiology: ordered.  Risk OTC drugs. Prescription drug management.   26 y/o F G4P2 presents to the ED for evaluation of lower abdominal cramping and vaginal bleeding since yesterday. Differential diagnosis includes but is not limited to threatened miscarriage, complete miscarriage, bleeding in pregnancy, subchorionic hemorrhage, placenta previa.  Vital signs are unremarkable.  Physical exam as noted above.  We will order ultrasound imaging and labs.  I independently reviewed and interpreted the patient's labs.  CBC shows slightly increased white blood cell count 10.6.  Hemoglobin slightly decreased at 8.9 although appears to be around patient's baseline, labs are 27 years old.  CMP shows mild decrease potassium 3.3 otherwise no LFT or electrolyte abnormality.  Lipase normal.  Urinalysis shows cloudy urine that is positive for nitrates with many bacteria however only 0-5 white blood cells.  Urinalysis consistent with UTI.  We will culture.  hCG quantitative is 102,668.  Tylenol was given for pain.  Ultrasound imaging shows Twin intrauterine pregnancy. Estimated gestational age by crown-rump length is 6 weeks 2 days for twin 1 and 6 weeks 4 days for twin 2. Small subchorionic hemorrhages are demonstrated.   Twin pregnancy is likely why her hCG quantitative is still elevated.  I am not concerned for her white blood cell count of 10.6.  I do not need this is any appendicitis.  She was more calm concerned about the bleeding that she was about the abdominal pain.  I do not think this is any appendicitis or colitis or diverticulitis.  Her lower abdominal pain could be from her UTI as well.  We will treat her UTI with Macrobid antibiotic, called and spoke to Sue Lush, her pH to confirm Macrobid and for trimester pregnancy.  We will also send her home with some  Diclegis that she reports has been having increased nausea with this pregnancy, again likely  due to her twin pregnancy.  Her ultrasound does show that she has small subchorionic hemorrhages which is likely from the flank to a small amount of blood that she has noted on her panty liner.  She is not having any gross blood.  We will order her some RhoGAM and potassium for replenishment and for vaginal bleeding in the setting of having Rh- blood.  I discussed with the patient her lab and imaging results.  Discussed that she will need to adhere to the antibiotic for UTI as these are dangerous in early pregnancy.  She was okay with receiving the RhoGAM shot as she has had a before with her previous pregnancies.  We discussed following up with an OB/GYN soon for further evaluation of his subchorionic hemorrhage.  We discussed bleeding precautions.  We discussed return precautions and red flag symptoms.  Patient verbalized her understanding and agrees to the plan.  Patient is stable and being discharged home in good condition.  Final Clinical Impression(s) / ED Diagnoses Final diagnoses:  Vaginal bleeding in pregnancy  Acute cystitis with hematuria    Rx / DC Orders ED Discharge Orders          Ordered    nitrofurantoin, macrocrystal-monohydrate, (MACROBID) 100 MG capsule  2 times daily        09/03/22 2004    Doxylamine-Pyridoxine (DICLEGIS) 10-10 MG TBEC  Daily PRN        09/03/22 2009              Achille Rich, [place] 09/05/22 1821    Linwood Dibbles, MD 09/08/22 [phone]

## 2022-09-06 LAB — URINE CULTURE: Culture: >=100000 — AB

## 2022-09-07 ENCOUNTER — Telehealth (HOSPITAL_BASED_OUTPATIENT_CLINIC_OR_DEPARTMENT_OTHER): Payer: Self-pay | Admitting: *Deleted

## 2022-09-07 NOTE — Telephone Encounter (Signed)
Post ED Visit - Positive Culture Follow-up  Culture report reviewed by antimicrobial stewardship pharmacist: [facility] Team []  Elenor Quinones, Pharm.D. []  Heide Guile, Pharm.D., BCPS AQ-ID []  Parks Neptune, Pharm.D., BCPS []  Alycia Rossetti, Pharm.D., BCPS []  [place], Pharm.D., BCPS, AAHIVP []  Legrand Como, Pharm.D., BCPS, AAHIVP []  Salome Arnt, PharmD, BCPS []  Johnnette Gourd, PharmD, BCPS []  Hughes Better, PharmD, BCPS []  Leeroy Cha, PharmD []  Laqueta Linden, PharmD, BCPS [x]  Roderic Ovens, PharmD  [facility] Team []  Leodis Sias, PharmD []  Lindell Spar, PharmD []  Royetta Asal, PharmD []  Graylin Shiver, Rph []  Rema Fendt) Glennon Mac, PharmD []  Arlyn Dunning, PharmD []  Netta Cedars, PharmD []  Dia Sitter, PharmD []  Leone Haven, PharmD []  Gretta Arab, PharmD []  Theodis Shove, PharmD []  Peggyann Juba, PharmD []  Reuel Boom, PharmD   Positive urine culture Treated with Nitrofurantoin , organism sensitive to the same and no further patient follow-up is required at this time.  Cindy Ware 09/07/2022, 12:27 PM

## 2023-03-09 ENCOUNTER — Encounter: Payer: Self-pay | Admitting: *Deleted
